# Patient Record
Sex: Female | Born: 2000 | Race: Black or African American | Hispanic: No | Marital: Single | Smoking: Never smoker
Health system: Southern US, Community
[De-identification: ages and names within clinical notes are randomized; demographics above are authoritative.]

## PROBLEM LIST (undated history)

## (undated) DIAGNOSIS — D649 Anemia, unspecified: Secondary | ICD-10-CM

## (undated) DIAGNOSIS — Z789 Other specified health status: Secondary | ICD-10-CM

## (undated) DIAGNOSIS — E875 Hyperkalemia: Secondary | ICD-10-CM

## (undated) HISTORY — PX: NO PAST SURGERIES: SHX2092

## (undated) HISTORY — PX: WISDOM TOOTH EXTRACTION: SHX21

---
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
## 2021-06-19 ENCOUNTER — Emergency Department (HOSPITAL_COMMUNITY)
Admission: EM | Admit: 2021-06-19 | Discharge: 2021-06-19 | Disposition: A | Discharge status: Home / Self Care | Payer: Medicaid Other | Attending: Emergency Medicine | Admitting: Emergency Medicine

## 2021-06-19 ENCOUNTER — Other Ambulatory Visit: Payer: Self-pay

## 2021-06-19 DIAGNOSIS — Z20822 Contact with and (suspected) exposure to covid-19: Secondary | ICD-10-CM | POA: Insufficient documentation

## 2021-06-19 DIAGNOSIS — J029 Acute pharyngitis, unspecified: Secondary | ICD-10-CM | POA: Diagnosis not present

## 2021-06-19 LAB — PREGNANCY, URINE: Preg Test, Ur: NEGATIVE

## 2021-06-19 LAB — RESP PANEL BY RT-PCR (FLU A&B, COVID) ARPGX2
Influenza A by PCR: NEGATIVE
Influenza B by PCR: NEGATIVE
SARS Coronavirus 2 by RT PCR: NEGATIVE

## 2021-06-19 LAB — GROUP A STREP BY PCR: Group A Strep by PCR: NOT DETECTED

## 2021-06-19 MED ORDER — LIDOCAINE VISCOUS HCL 2 % MT SOLN: 5.0000 mL | Freq: Three times a day (TID) | OROMUCOSAL | 0 refills | Status: DC | PRN

## 2021-06-19 NOTE — Discharge Instructions (Addendum)
Your COVID, flu, and strep test were all negative. Your symptoms are likely viral in nature.   Please take Ibuprofen and Tylenol as needed for pain. Pick up magic mouthwash and use as needed.   Follow up with your PCP for further eval  Return to the ED for any new/worsening symptoms

## 2021-06-19 NOTE — ED Triage Notes (Signed)
Pt reports sore throat since Friday. Pt reports taking dayquil and mucinex at home. Pt also reports nasal congestion

## 2021-06-19 NOTE — ED Provider Notes (Signed)
[facility] DEPT Provider Note   CSN: [id number] Arrival date & time: 06/19/21  [id number]     History  No chief complaint on file.   Sarah Hopkins is a 21 y.o. female who presents to the ED today with complaint of gradual onset, constant, achy, sore throat for the past 3 days.  She also complains of new onset dry cough and nasal congestion today.  She denies any fevers.  She has been taking over-the-counter DayQuil and NyQuil without much relief.  She is still able to swallow however states that it is painful.  Denies any voice change.  Denies any recent sick contacts.  Is vaccinated for COVID and flu.  Reports history of strep in the past however is unsure if this feels similar.   The history is provided by the patient and medical records.      Home Medications Prior to Admission medications   Medication Sig Start Date End Date Taking? Authorizing Provider  magic mouthwash (lidocaine, diphenhydrAMINE, alum & mag hydroxide) suspension Swish and spit 5 mLs 3 (three) times daily as needed for mouth pain. 06/19/21  Yes Derrell Milanes, PA-C  bacitracin 500 UNIT/GM ointment Apply 1 application topically 2 (two) times daily. Patient not taking: No sig reported 12/06/13   Presson, Audelia Hives, PA  doxycycline (VIBRAMYCIN) 100 MG capsule Take 1 capsule (100 mg total) by mouth 2 (two) times daily. 10/04/20   Caccavale, Sophia, PA-C  ibuprofen (ADVIL,MOTRIN) 400 MG tablet Take 1 tablet (400 mg total) by mouth every 6 (six) hours as needed. Patient not taking: No sig reported 11/21/17   Griffin Basil, NP  ondansetron (ZOFRAN ODT) 4 MG disintegrating tablet Take 1 tablet (4 mg total) by mouth every 8 (eight) hours as needed. Patient not taking: No sig reported 05/05/18   Charmayne Sheer, NP  promethazine-dextromethorphan (PROMETHAZINE-DM) 6.25-15 MG/5ML syrup Take 5 mLs by mouth 4 (four) times daily as needed for cough. Patient not taking: No sig reported 06/07/16    Etta Quill, NP      Allergies    Prednisone    Review of Systems   Review of Systems  Constitutional:  Negative for chills and fever.  HENT:  Positive for congestion and sore throat. Negative for trouble swallowing and voice change.   Respiratory:  Positive for cough.   Musculoskeletal:  Negative for myalgias.  All other systems reviewed and are negative.  Physical Exam Updated Vital Signs BP (!) 142/82    Pulse 99    Temp 99.4 F (37.4 C) (Oral)    Resp 18    SpO2 100%  Physical Exam Vitals and nursing note reviewed.  Constitutional:      Appearance: She is not ill-appearing.  HENT:     Head: Normocephalic and atraumatic.     Mouth/Throat:     Mouth: Mucous membranes are moist.     Pharynx: Oropharyngeal exudate and posterior oropharyngeal erythema present.     Comments: Bilateral tonsillar hypertrophy with exudate on right tonsil. Uvula midline. Tolerating secretions without difficulty and phonating normally.  Eyes:     Conjunctiva/sclera: Conjunctivae normal.  Cardiovascular:     Rate and Rhythm: Normal rate and regular rhythm.     Pulses: Normal pulses.  Pulmonary:     Effort: Pulmonary effort is normal.     Breath sounds: Normal breath sounds. No wheezing, rhonchi or rales.  Lymphadenopathy:     Cervical: No cervical adenopathy.  Skin:    General: Skin is  warm and dry.     Coloration: Skin is not jaundiced.  Neurological:     Mental Status: She is alert.    ED Results / Procedures / Treatments   Labs (all labs ordered are listed, but only abnormal results are displayed) Labs Reviewed  GROUP A STREP BY PCR  RESP PANEL BY RT-PCR (FLU A&B, COVID) [id number]  PREGNANCY, URINE    EKG None  Radiology No results found.  Procedures Procedures    Medications Ordered in ED Medications - No data to display  ED Course/ Medical Decision Making/ A&P                           Medical Decision Making 21 year old female who presents to the ED today with  complaint of sore throat x3 days, nasal congestion and cough x1 day.  On arrival to the ED today patient temperature is 99.4.  Remainder vitals are unremarkable.  She appears to be no acute distress.  Phonating normally and tolerating own secretions without difficulty.  She is noted to have bilateral tonsillar hypertrophy with exudate on right tonsil on exam.  Lungs clear to auscultation bilaterally.  Plan to swab for COVID, flu, strep at this time.  We will plan for urine pregnancy test in case patient requires antibiotics.  If negative suspect viral pharyngitis.  Problems Addressed: Viral pharyngitis: acute illness or injury  Amount and/or Complexity of Data Reviewed Labs: ordered.    Details: UPT negative COVID and flu negative STrep test negative          Final Clinical Impression(s) / ED Diagnoses Final diagnoses:  Viral pharyngitis    Rx / DC Orders ED Discharge Orders          Ordered    magic mouthwash (lidocaine, diphenhydrAMINE, alum & mag hydroxide) suspension  3 times daily PRN        06/19/21 0938             Discharge Instructions      Your COVID, flu, and strep test were all negative. Your symptoms are likely viral in nature.   Please take Ibuprofen and Tylenol as needed for pain. Pick up magic mouthwash and use as needed.   Follow up with your PCP for further eval  Return to the ED for any new/worsening symptoms       Eustaquio Maize, PA-C 06/19/21 [id number]    Isla Pence, MD 06/19/21 1544

## 2021-08-22 ENCOUNTER — Emergency Department (HOSPITAL_COMMUNITY)
Admission: EM | Admit: 2021-08-22 | Discharge: 2021-08-23 | Discharge status: Against Medical Advice | Payer: Medicaid Other | Attending: Emergency Medicine | Admitting: Emergency Medicine

## 2021-08-22 ENCOUNTER — Other Ambulatory Visit: Payer: Self-pay

## 2021-08-22 ENCOUNTER — Encounter (HOSPITAL_COMMUNITY): Payer: Self-pay

## 2021-08-22 DIAGNOSIS — R1030 Lower abdominal pain, unspecified: Secondary | ICD-10-CM | POA: Insufficient documentation

## 2021-08-22 DIAGNOSIS — Z5321 Procedure and treatment not carried out due to patient leaving prior to being seen by health care provider: Secondary | ICD-10-CM | POA: Diagnosis not present

## 2021-08-22 NOTE — ED Provider Triage Note (Signed)
Emergency Medicine Provider Triage Evaluation Note ? ?Sarah Hopkins , a 21 y.o. female  was evaluated in triage.  Pt complains of left lower pelvic/suprapubic pain that started about 5 days ago and has been constant since then.  She describes the pain as a measure between a menstrual cramp and a "charley horse".  She has taken over-the-counter medications without improvement.  She denies nausea, vomiting, diarrhea, vaginal discharge, vaginal pain, urinary symptoms. ? ?Review of Systems  ?Positive:  ?Negative:  ? ?Physical Exam  ?BP (!) 124/91 (BP Location: Left Arm)   Pulse 76   Temp 98.1 ?F (36.7 ?C) (Oral)   Resp 18   Ht 5\' 1"  (1.549 m)   Wt 95.3 kg   SpO2 100%   BMI 39.68 kg/m?  ?Gen:   Awake, no distress, nontoxic-appearing, able to speak in complete sentences ?Resp:  Normal effort  ?MSK:   Moves extremities without difficulty  ?Other:  Mild to moderate tenderness palpation of the left pelvic region.  Abdomen is otherwise soft, nondistended. ? ?Medical Decision Making  ?Medically screening exam initiated at 11:53 PM.  Appropriate orders placed.  Lindamarie BAILI STANG was informed that the remainder of the evaluation will be completed by another provider, this initial triage assessment does not replace that evaluation, and the importance of remaining in the ED until their evaluation is complete. ? ? ?  ?Jannetta Quint, Janell Quiet ?08/22/21 2355 ? ?

## 2021-08-22 NOTE — ED Triage Notes (Signed)
Patient has had lower abdominal pain below her belly button for 2 days. She said it feels like a charley horse with a mixture of period cramps. No vomiting or diarrhea.  ?

## 2021-08-23 ENCOUNTER — Other Ambulatory Visit: Payer: Self-pay

## 2021-08-23 ENCOUNTER — Encounter (HOSPITAL_COMMUNITY): Payer: Self-pay

## 2021-08-23 ENCOUNTER — Emergency Department (HOSPITAL_COMMUNITY)
Admission: EM | Admit: 2021-08-23 | Discharge: 2021-08-23 | Disposition: A | Discharge status: Home / Self Care | Payer: Medicaid Other | Attending: Medical | Admitting: Medical

## 2021-08-23 DIAGNOSIS — Z5321 Procedure and treatment not carried out due to patient leaving prior to being seen by health care provider: Secondary | ICD-10-CM | POA: Diagnosis not present

## 2021-08-23 DIAGNOSIS — R110 Nausea: Secondary | ICD-10-CM | POA: Diagnosis not present

## 2021-08-23 DIAGNOSIS — R1032 Left lower quadrant pain: Secondary | ICD-10-CM | POA: Diagnosis present

## 2021-08-23 HISTORY — DX: Hyperkalemia: E87.5

## 2021-08-23 LAB — URINALYSIS, ROUTINE W REFLEX MICROSCOPIC
Bacteria, UA: NONE SEEN
Bilirubin Urine: NEGATIVE
Glucose, UA: NEGATIVE mg/dL
Hgb urine dipstick: NEGATIVE
Ketones, ur: 5 mg/dL — AB
Nitrite: NEGATIVE
Protein, ur: NEGATIVE mg/dL
Specific Gravity, Urine: 1.021 (ref 1.005–1.030)
pH: 6 (ref 5.0–8.0)

## 2021-08-23 NOTE — ED Triage Notes (Signed)
Patient c/o LLQ abdominal pain and nausea x 7 days. Patient denies any V/D. ?

## 2021-08-23 NOTE — ED Notes (Signed)
Called pt. To triage x 2, no answer. ?

## 2021-08-23 NOTE — ED Notes (Signed)
I called patient for lab draw in the lobby and outside and no one responded. ?

## 2021-08-23 NOTE — ED Provider Triage Note (Signed)
Emergency Medicine Provider Triage Evaluation Note ? ?Sarah Hopkins , a 21 y.o. female  was evaluated in triage.  Pt complains of vaginal onset, constant, achy, suprapubic/left lower quadrant abdominal pain that began 1 week ago.  Patient also complains of nausea.  She was here in the ED yesterday however left prior to being seen.  She denies any recent sick contacts.  No vomiting, diarrhea.  Last normal bowel movement earlier today.  She denies any vaginal discharge.  Last normal menstrual cycle on 04/01. ? ?Review of Systems  ?Positive: + abd pain, nausea ?Negative: - fevers, chills, urinary symptoms, vaginal discharge, vomiting, diarrhea, constipation ? ?Physical Exam  ?BP 130/87 (BP Location: Left Arm)   Pulse 92   Temp 98.2 ?F (36.8 ?C) (Oral)   Resp 16   Ht 5\' 1"  (1.549 m)   Wt 95.3 kg   LMP 08/06/2021 (Approximate)   SpO2 99%   BMI 39.68 kg/m?  ?Gen:   Awake, no distress   ?Resp:  Normal effort  ?MSK:   Moves extremities without difficulty  ?Other:  + suprapubic and LLQ TTP with voluntary guarding ? ?Medical Decision Making  ?Medically screening exam initiated at 12:45 PM.  Appropriate orders placed.  Sarah Hopkins was informed that the remainder of the evaluation will be completed by another provider, this initial triage assessment does not replace that evaluation, and the importance of remaining in the ED until their evaluation is complete. ? ? ?  ?Jannetta Quint, PA-C ?08/23/21 1247 ? ?

## 2021-08-23 NOTE — ED Notes (Signed)
I called patient to collect labs and no one responded 

## 2022-04-19 ENCOUNTER — Encounter: Payer: Self-pay | Admitting: Obstetrics

## 2022-04-19 ENCOUNTER — Ambulatory Visit (INDEPENDENT_AMBULATORY_CARE_PROVIDER_SITE_OTHER): Payer: Medicaid Other

## 2022-04-19 ENCOUNTER — Ambulatory Visit: Payer: Medicaid Other | Admitting: *Deleted

## 2022-04-19 VITALS — BP 126/84 | HR 86 | Ht 61.0 in | Wt 196.9 lb

## 2022-04-19 DIAGNOSIS — O3680X0 Pregnancy with inconclusive fetal viability, not applicable or unspecified: Secondary | ICD-10-CM

## 2022-04-19 DIAGNOSIS — Z3480 Encounter for supervision of other normal pregnancy, unspecified trimester: Secondary | ICD-10-CM

## 2022-04-19 DIAGNOSIS — Z3A08 8 weeks gestation of pregnancy: Secondary | ICD-10-CM | POA: Diagnosis not present

## 2022-04-19 DIAGNOSIS — O219 Vomiting of pregnancy, unspecified: Secondary | ICD-10-CM

## 2022-04-19 MED ORDER — BLOOD PRESSURE KIT DEVI: 1.0000 | 0 refills | Status: AC

## 2022-04-19 MED ORDER — PROMETHAZINE HCL 25 MG PO TABS: 25.0000 mg | ORAL_TABLET | Freq: Four times a day (QID) | ORAL | 1 refills | Status: DC | PRN

## 2022-04-19 NOTE — Progress Notes (Signed)
New OB Intake  I connected withNAME@ on 04/19/22 at 10:15 AM EST by In Person Visit and verified that I am speaking with the correct person using two identifiers. Nurse is located at [facility] and pt is located at [place].  I discussed the limitations, risks, security and privacy concerns of performing an evaluation and management service by telephone and the availability of in person appointments. I also discussed with the patient that there may be a patient responsible charge related to this service. The patient expressed understanding and agreed to proceed.  I explained I am completing New OB Intake today. We discussed EDD of 11/21/22 that is based on LMP of 02/14/22. Pt is G1/P0. I reviewed her allergies, medications, Medical/Surgical/OB history, and appropriate screenings. I informed her of [facility] services. [facility] information placed in AVS. Based on history, this is a low risk pregnancy.  There are no problems to display for this patient.   Concerns addressed today  Delivery Plans Plans to deliver at [facility]. Patient given information for [facility] website for more information about [facility]. Patient is not interested in water birth. Offered upcoming OB visit with CNM to discuss further.  MyChart/Babyscripts MyChart access verified. I explained pt will have some visits in office and some virtually. Babyscripts instructions given and order placed. Patient verifies receipt of registration text/e-mail. Account successfully created and app downloaded.  Blood Pressure Cuff/Weight Scale Blood pressure cuff ordered for patient to pick-up from [employer]. Explained after first prenatal appt pt will check weekly and document in Babyscripts. Patient does not have weight scale; patient may purchase if they desire to track weight weekly in Babyscripts.  Anatomy US Explained first scheduled [place] will be around 19 weeks. Anatomy US scheduled for 19 wks at [facility]. Pt notified to  arrive at TBD.  Labs Discussed Avelina Laine genetic screening with patient. Would like both Panorama and Horizon drawn at new OB visit. Routine prenatal labs needed.  COVID Vaccine Patient has had COVID vaccine.   Social Determinants of Health Food Insecurity: Patient denies food insecurity. WIC Referral: Patient is interested in referral to [facility].  Transportation: Patient denies transportation needs. Childcare: Discussed no children allowed at ultrasound appointments. Offered childcare services; patient declines childcare services at this time.  First visit review I reviewed new OB appt with patient. I explained they will have a provider visit that includes labs. Explained pt will be seen by Dr. Clearance Coots at first visit; encounter routed to appropriate provider. Explained that patient will be seen by pregnancy navigator following visit with provider.   Harrel Lemon, RN 04/19/2022  10:32 AM

## 2022-04-24 ENCOUNTER — Other Ambulatory Visit: Payer: Self-pay

## 2022-04-24 ENCOUNTER — Encounter (HOSPITAL_COMMUNITY): Payer: Self-pay | Admitting: Family Medicine

## 2022-04-24 ENCOUNTER — Inpatient Hospital Stay (HOSPITAL_COMMUNITY): Payer: Medicaid Other

## 2022-04-24 ENCOUNTER — Inpatient Hospital Stay (HOSPITAL_COMMUNITY)
Admission: AD | Admit: 2022-04-24 | Discharge: 2022-04-24 | Disposition: A | Discharge status: Home / Self Care | Payer: Medicaid Other | Attending: Family Medicine | Admitting: Family Medicine

## 2022-04-24 DIAGNOSIS — B338 Other specified viral diseases: Secondary | ICD-10-CM

## 2022-04-24 DIAGNOSIS — Z3A09 9 weeks gestation of pregnancy: Secondary | ICD-10-CM | POA: Diagnosis not present

## 2022-04-24 DIAGNOSIS — B974 Respiratory syncytial virus as the cause of diseases classified elsewhere: Secondary | ICD-10-CM | POA: Diagnosis not present

## 2022-04-24 DIAGNOSIS — O26891 Other specified pregnancy related conditions, first trimester: Secondary | ICD-10-CM | POA: Diagnosis present

## 2022-04-24 DIAGNOSIS — O99511 Diseases of the respiratory system complicating pregnancy, first trimester: Secondary | ICD-10-CM | POA: Insufficient documentation

## 2022-04-24 HISTORY — DX: Other specified health status: Z78.9

## 2022-04-24 LAB — CBC
HCT: 36.4 % (ref 36.0–46.0)
Hemoglobin: 12.6 g/dL (ref 12.0–15.0)
MCH: 30.1 pg (ref 26.0–34.0)
MCHC: 34.6 g/dL (ref 30.0–36.0)
MCV: 86.9 fL (ref 80.0–100.0)
Platelets: 309 10*3/uL (ref 150–400)
RBC: 4.19 MIL/uL (ref 3.87–5.11)
RDW: 12.2 % (ref 11.5–15.5)
WBC: 8.2 10*3/uL (ref 4.0–10.5)
nRBC: 0 % (ref 0.0–0.2)

## 2022-04-24 LAB — URINALYSIS, ROUTINE W REFLEX MICROSCOPIC
Bilirubin Urine: NEGATIVE
Glucose, UA: NEGATIVE mg/dL
Hgb urine dipstick: NEGATIVE
Ketones, ur: NEGATIVE mg/dL
Leukocytes,Ua: NEGATIVE
Nitrite: NEGATIVE
Protein, ur: NEGATIVE mg/dL
Specific Gravity, Urine: 1.008 (ref 1.005–1.030)
pH: 6 (ref 5.0–8.0)

## 2022-04-24 NOTE — MAU Note (Addendum)
.  Sarah Hopkins is a 21 y.o. at [redacted]w[redacted]d here in MAU reporting: she has been having runny nose, sneezing, productive cough (snot-like sputum), sharp, right-sided chest pain when she coughs (7/10), SOB with exertion or when laying down, generalized body aches (9/10), loss of appetite, N/V (1 episode of vomiting today) since 04/15/2022. She has taken Promethazine and no longer feels nauseous. Dx with RSV on 04/22/22 by primary care. She has been taking Mucinex, Robitussin, and Tylenol Cold & Flu since being sick, has not taken these in the past two days and did not get any relief from her symptoms. Denies VB.   LMP: 02/14/2022 Onset of complaint: 04/15/22 Pain score: 7/10 chest pain, 9/10 generalized body aches Vitals:   04/24/22 0959  BP: 127/74  Pulse: 86  Resp: 18  Temp: 98.3 F (36.8 C)  SpO2: 100%     FHT:N/A Lab orders placed from triage:  UA

## 2022-04-24 NOTE — Discharge Instructions (Signed)

## 2022-04-24 NOTE — MAU Provider Note (Signed)
History     CSN: [id number]  Arrival date and time: 04/24/22 2355   Event Date/Time   First Provider Initiated Contact with Patient 04/24/22 1013      Chief Complaint  Patient presents with   Chest Pain   Cough   Generalized Body Aches   HPI  Sarah Hopkins is a 21 y.o. G1P0000 at [redacted]w[redacted]d who presents for evaluation of cough and shortness of breath. Patient reports she was diagnosed with RSV on 12/16. She is taking OTC symptom relief with minimal relief. She reports she feels short of breath when she lays down and it hurts to cough. She states her mom wanted her to come in and make sure she does not have pneumonia.  She denies any pain. She denies any vaginal bleeding, discharge, and leaking of fluid. Denies any constipation, diarrhea or any urinary complaints.    OB History     Gravida  1   Para  0   Term  0   Preterm  0   AB  0   Living  0      SAB  0   IAB  0   Ectopic  0   Multiple  0   Live Births  0           Past Medical History:  Diagnosis Date   Hyperkalemia    Medical history non-contributory     Past Surgical History:  Procedure Laterality Date   NO PAST SURGERIES      Family History  Problem Relation Age of Onset   Hypertension Paternal Grandfather    Kidney failure Paternal Grandfather    Heart Problems Maternal Grandmother    Diabetes Maternal Grandfather     Social History   Tobacco Use   Smoking status: Never   Smokeless tobacco: Never  Vaping Use   Vaping Use: Never used  Substance Use Topics   Alcohol use: No    Comment: social   Drug use: Not Currently    Types: Marijuana    Allergies:  Allergies  Allergen Reactions   Prednisone Anaphylaxis    Other reaction(s): Cramps (ALLERGY/intolerance) Insomnia, severe Depression/moody    No medications prior to admission.    Review of Systems  Constitutional:  Positive for fatigue. Negative for fever.  HENT:  Positive for congestion, sinus pressure and  sneezing.   Respiratory:  Positive for cough and shortness of breath.   Cardiovascular: Negative.  Negative for chest pain.  Gastrointestinal: Negative.  Negative for abdominal pain, constipation, diarrhea, nausea and vomiting.  Genitourinary: Negative.  Negative for dysuria, vaginal bleeding and vaginal discharge.  Musculoskeletal:  Positive for myalgias.  Neurological: Negative.  Negative for dizziness and headaches.   Physical Exam   Blood pressure 117/79, pulse 78, temperature 98.3 F (36.8 C), temperature source Oral, resp. rate 18, height 5\' 1"  (1.549 m), weight 92.4 kg, last menstrual period 02/14/2022, SpO2 100 %.  Patient Vitals for the past 24 hrs:  BP Temp Temp src Pulse Resp SpO2 Height Weight  04/24/22 1057 117/79 -- -- 78 -- -- -- --  04/24/22 1006 119/76 -- -- 95 -- 100 % -- --  04/24/22 0959 127/74 98.3 F (36.8 C) Oral 86 18 100 % 5\' 1"  (1.549 m) 92.4 kg    Physical Exam Vitals and nursing note reviewed.  Constitutional:      General: She is not in acute distress.    Appearance: She is well-developed.  HENT:     Head:  Normocephalic.  Eyes:     Pupils: Pupils are equal, round, and reactive to light.  Cardiovascular:     Rate and Rhythm: Normal rate and regular rhythm.     Heart sounds: Normal heart sounds.  Pulmonary:     Effort: Pulmonary effort is normal. No respiratory distress.     Breath sounds: Normal breath sounds.  Abdominal:     General: Bowel sounds are normal. There is no distension.     Palpations: Abdomen is soft.     Tenderness: There is no abdominal tenderness.  Skin:    General: Skin is warm and dry.  Neurological:     Mental Status: She is alert and oriented to person, place, and time.  Psychiatric:        Mood and Affect: Mood normal.        Behavior: Behavior normal.        Thought Content: Thought content normal.        Judgment: Judgment normal.     MAU Course  Procedures  Results for orders placed or performed during the  hospital encounter of 04/24/22 (from the past 24 hour(s))  Urinalysis, Routine w reflex microscopic     Status: None   Collection Time: 04/24/22 10:12 AM  Result Value Ref Range   Color, Urine YELLOW YELLOW   APPearance CLEAR CLEAR   Specific Gravity, Urine 1.008 1.005 - 1.030   pH 6.0 5.0 - 8.0   Glucose, UA NEGATIVE NEGATIVE mg/dL   Hgb urine dipstick NEGATIVE NEGATIVE   Bilirubin Urine NEGATIVE NEGATIVE   Ketones, ur NEGATIVE NEGATIVE mg/dL   Protein, ur NEGATIVE NEGATIVE mg/dL   Nitrite NEGATIVE NEGATIVE   Leukocytes,Ua NEGATIVE NEGATIVE  CBC     Status: None   Collection Time: 04/24/22 10:29 AM  Result Value Ref Range   WBC 8.2 4.0 - 10.5 K/uL   RBC 4.19 3.87 - 5.11 MIL/uL   Hemoglobin 12.6 12.0 - 15.0 g/dL   HCT 04.5 99.7 - 74.1 %   MCV 86.9 80.0 - 100.0 fL   MCH 30.1 26.0 - 34.0 pg   MCHC 34.6 30.0 - 36.0 g/dL   RDW 42.3 95.3 - 20.2 %   Platelets 309 150 - 400 K/uL   nRBC 0.0 0.0 - 0.2 %     DG Chest 2 View  Result Date: 04/24/2022 CLINICAL DATA:  Shortness of breath. EXAM: CHEST - 2 VIEW COMPARISON:  June 05, 2010. FINDINGS: The heart size and mediastinal contours are within normal limits. Both lungs are clear. The visualized skeletal structures are unremarkable. IMPRESSION: No active cardiopulmonary disease. Electronically Signed   By: Lupita Raider M.D.   On: 04/24/2022 10:47     MDM Labs ordered and reviewed.   UA CBC Chest Xray  CNM provided reassurance of normalcy of exam and vital signs within normal limits. Reviewed typical course of RSV and options for symptom treatment reviewed again.   Assessment and Plan   1. RSV (respiratory syncytial virus infection)   2. [redacted] weeks gestation of pregnancy     -Discharge home in stable condition -URI precautions discussed -Patient advised to follow-up with OB as scheduled for prenatal care -Patient may return to MAU as needed or if her condition were to change or worsen  Rolm Bookbinder, CNM 04/24/2022,  10:14 AM

## 2022-05-03 ENCOUNTER — Encounter: Payer: Self-pay | Admitting: Obstetrics & Gynecology

## 2022-05-05 ENCOUNTER — Telehealth: Payer: Self-pay | Admitting: Emergency Medicine

## 2022-05-05 NOTE — Telephone Encounter (Signed)
TC to patient to discuss blood pressure readings, discussed Dr. Roberts Gaudy instructions from 12/27 note.  Hello,    We were notified of your one elevated BP reading.  Once you get any elevated reading, please retake it in about 15 minutes. Please make sure you have been sitting and resting for at least 5 minutes before checking your blood pressure and that you are not talking to anyone during the reading.  If the top number is above 140 or bottom number is above 90, we will be notified and we will contact you with further recommendations. Given that you have no history of blood pressure issues, no intervention is needed for now. If your BP continues to be elevated, we may have to start you on some medications to help with this.   Please continue to check your BP as instructed and let us know of any concerns.     Jaynie Collins, MD

## 2022-05-11 ENCOUNTER — Encounter: Payer: Self-pay | Admitting: Obstetrics

## 2022-05-11 ENCOUNTER — Ambulatory Visit (INDEPENDENT_AMBULATORY_CARE_PROVIDER_SITE_OTHER): Payer: Medicaid Other | Admitting: Licensed Clinical Social Worker

## 2022-05-11 ENCOUNTER — Other Ambulatory Visit (HOSPITAL_COMMUNITY)
Admission: RE | Admit: 2022-05-11 | Discharge: 2022-05-11 | Disposition: A | Discharge status: Home / Self Care | Payer: Medicaid Other | Source: Ambulatory Visit | Attending: Obstetrics | Admitting: Obstetrics

## 2022-05-11 ENCOUNTER — Ambulatory Visit (INDEPENDENT_AMBULATORY_CARE_PROVIDER_SITE_OTHER): Payer: Medicaid Other | Admitting: Obstetrics

## 2022-05-11 VITALS — BP 119/80 | HR 83 | Wt 205.7 lb

## 2022-05-11 DIAGNOSIS — Z3A12 12 weeks gestation of pregnancy: Secondary | ICD-10-CM

## 2022-05-11 DIAGNOSIS — O219 Vomiting of pregnancy, unspecified: Secondary | ICD-10-CM

## 2022-05-11 DIAGNOSIS — Z3480 Encounter for supervision of other normal pregnancy, unspecified trimester: Secondary | ICD-10-CM

## 2022-05-11 DIAGNOSIS — Z3481 Encounter for supervision of other normal pregnancy, first trimester: Secondary | ICD-10-CM

## 2022-05-11 MED ORDER — PROMETHAZINE HCL 25 MG PO TABS: 25.0000 mg | ORAL_TABLET | Freq: Four times a day (QID) | ORAL | 1 refills | Status: DC | PRN

## 2022-05-11 NOTE — Progress Notes (Signed)
Patient presents for New OB. Patient has concerns about her blood pressure. No other concerns today.

## 2022-05-11 NOTE — Progress Notes (Signed)
Subjective:    Sarah Hopkins is being seen today for her first obstetrical visit.  This is not a planned pregnancy. She is at [redacted]w[redacted]d gestation. Her obstetrical history is significant for  none . Relationship with FOB: significant other, not living together. Patient does intend to breast feed. Pregnancy history fully reviewed.  The information documented in the HPI was reviewed and verified.  Menstrual History: OB History     Gravida  1   Para  0   Term  0   Preterm  0   AB  0   Living  0      SAB  0   IAB  0   Ectopic  0   Multiple  0   Live Births  0           Patient's last menstrual period was 02/14/2022 (exact date).    Past Medical History:  Diagnosis Date   Hyperkalemia     Past Surgical History:  Procedure Laterality Date   NO PAST SURGERIES      (Not in a hospital admission)  Allergies  Allergen Reactions   Prednisone Anaphylaxis    Other reaction(s): Cramps (ALLERGY/intolerance) Insomnia, severe Depression/moody    Social History   Tobacco Use   Smoking status: Never   Smokeless tobacco: Never  Substance Use Topics   Alcohol use: No    Comment: social    Family History  Problem Relation Age of Onset   Hypertension Paternal Grandfather    Kidney failure Paternal Grandfather    Heart Problems Maternal Grandmother    Diabetes Maternal Grandfather      Review of Systems Constitutional: negative for weight loss Gastrointestinal: negative for vomiting Genitourinary:negative for genital lesions and vaginal discharge and dysuria Musculoskeletal:negative for back pain Behavioral/Psych: negative for abusive relationship, depression, illegal drug usage and tobacco use    Objective:    BP 119/80   Pulse 83   Wt 205 lb 11.2 oz (93.3 kg)   LMP 02/14/2022 (Exact Date)   BMI 38.87 kg/m  General Appearance:    Alert, cooperative, no distress, appears stated age  Head:    Normocephalic, without obvious abnormality, atraumatic  Eyes:     PERRL, conjunctiva/corneas clear, EOM's intact, fundi    benign, both eyes  Ears:    Normal TM's and external ear canals, both ears  Nose:   Nares normal, septum midline, mucosa normal, no drainage    or sinus tenderness  Throat:   Lips, mucosa, and tongue normal; teeth and gums normal  Neck:   Supple, symmetrical, trachea midline, no adenopathy;    thyroid:  no enlargement/tenderness/nodules; no carotid   bruit or JVD  Back:     Symmetric, no curvature, ROM normal, no CVA tenderness  Lungs:     Clear to auscultation bilaterally, respirations unlabored  Chest Wall:    No tenderness or deformity   Heart:    Regular rate and rhythm, S1 and S2 normal, no murmur, rub   or gallop  Breast Exam:    No tenderness, masses, or nipple abnormality  Abdomen:     Soft, non-tender, bowel sounds active all four quadrants,    no masses, no organomegaly  Genitalia:    Normal female without lesion, discharge or tenderness  Extremities:   Extremities normal, atraumatic, no cyanosis or edema  Pulses:   2+ and symmetric all extremities  Skin:   Skin color, texture, turgor normal, no rashes or lesions  Lymph nodes:  Cervical, supraclavicular, and axillary nodes normal  Neurologic:   CNII-XII intact, normal strength, sensation and reflexes    throughout      Lab Review Urine pregnancy test Labs reviewed yes Radiologic studies reviewed no  Assessment:    Pregnancy at [redacted]w[redacted]d weeks    Plan:      Prenatal vitamins.  Counseling provided regarding continued use of seat belts, cessation of alcohol consumption, smoking or use of illicit drugs; infection precautions i.e., influenza/TDAP immunizations, toxoplasmosis,CMV, parvovirus, listeria and varicella; workplace safety, exercise during pregnancy; routine dental care, safe medications, sexual activity, hot tubs, saunas, pools, travel, caffeine use, fish and methlymercury, potential toxins, hair treatments, varicose veins Weight gain recommendations per IOM  guidelines reviewed: underweight/BMI< 18.5--> gain 28 - 40 lbs; normal weight/BMI 18.5 - 24.9--> gain 25 - 35 lbs; overweight/BMI 25 - 29.9--> gain 15 - 25 lbs; obese/BMI >30->gain  11 - 20 lbs Problem list reviewed and updated. FIRST/CF mutation testing/NIPT/QUAD SCREEN/fragile X/Ashkenazi Jewish population testing/Spinal muscular atrophy discussed: requested. Role of ultrasound in pregnancy discussed; fetal survey: requested. Amniocentesis discussed: not indicated.  Meds ordered this encounter  Medications   promethazine (PHENERGAN) 25 MG tablet    Sig: Take 1 tablet (25 mg total) by mouth every 6 (six) hours as needed for nausea or vomiting.    Dispense:  30 tablet    Refill:  1   Orders Placed This Encounter  Procedures   Culture, OB Urine   [place] MFM OB COMP + 14 WK    Standing Status:   Future    Standing Expiration Date:   05/12/2023    Order Specific Question:   Reason for Exam (SYMPTOM  OR DIAGNOSIS REQUIRED)    Answer:   Anatomy    Order Specific Question:   Preferred Location    Answer:   [facility] Ultrasound   CBC/D/Plt+RPR+Rh+ABO+RubIgG...   Panorama Prenatal Test Full Panel    ==========Department Information========== ID: [id number] Department:[facility] [place], [place] [address] Dept: [phone] Dept Fax: [phone]     Order Specific Question:   Expected due date (MM/DD/YYYY):    Answer:   11/21/2022    Order Specific Question:   Is this a twin pregnancy? (viable, no vanished twin)    Answer:   No    Order Specific Question:   Is this a surrogate or egg donor pregnancy?    Answer:   No    Order Specific Question:   I want fetal sex included in the report:    Answer:   Yes    Order Specific Question:   Maternal Weight (lbs):    Answer:   31    Order Specific Question:   Which Microdeletion Panel should be ordered?    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    Answer:   Programme researcher, broadcasting/film/video Question:   By placing this electronic order I confirm the testing ordered herein is medically necessary and this patient has been informed of the details of the genetic test(s) ordered, including the risks, benefits, and alternatives, and has consented to testing.    Answer:   Yes    Order Specific Question:   Select an order diagnosis: For additional options refer to [email or web address]    Answer:   Encounter for  supervision of normal first pregnancy in first trimester [669301]   HORIZON Custom    ==========Department Information========== ID: [id number] Department:[facility] AT [facility] [address] [place] [address] Dept: [phone] Dept Fax: [phone]     Order Specific Question:   Specify the name or ID of a valid Horizon Custom Panel:    Answer:   H-Basic    Order Specific Question:   Is patient pregnant?    Answer:   Yes    Order Specific Question:   Ethnicity of patient:    Answer:   African American    Order Specific Question:   Practice ensures that HIPAA consent is obtained and will make available to [facility] upon request?    Answer:   Yes    Order Specific Question:   By placing this electronic order I confirm the testing ordered herein is medically necessary and this patient has been informed of the details of the genetic test(s) ordered, including the risks, benefits, and alternatives, and has consented to testing.    Answer:   Yes    Order Specific Question:   What type of billing?    Answer:   [employer] Specific Question:   Select an order diagnosis: For additional options refer to [email or web address]    Answer:   Encounter for supervision of other normal pregnancy, first trimester [4287681]    Order Specific Question:   Tay-Sachs add-on  test?    Answer:   No    Follow up in 4 weeks.  I have spent a total of 20 minutes of face-to-face time, excluding clinical staff time, reviewing notes and preparing to see patient, ordering tests and/or medications, and counseling the patient.   Marquavis Hannen A. Clearance Coots MD 05/11/2022

## 2022-05-12 ENCOUNTER — Other Ambulatory Visit: Payer: Self-pay | Admitting: Obstetrics

## 2022-05-12 DIAGNOSIS — N760 Acute vaginitis: Secondary | ICD-10-CM

## 2022-05-12 LAB — CBC/D/PLT+RPR+RH+ABO+RUBIGG...
Antibody Screen: NEGATIVE
Basophils Absolute: 0 10*3/uL (ref 0.0–0.2)
Basos: 0 %
EOS (ABSOLUTE): 0.1 10*3/uL (ref 0.0–0.4)
Eos: 1 %
HCV Ab: NONREACTIVE
HIV Screen 4th Generation wRfx: NONREACTIVE
Hematocrit: 35.7 % (ref 34.0–46.6)
Hemoglobin: 12.1 g/dL (ref 11.1–15.9)
Hepatitis B Surface Ag: NEGATIVE
Immature Grans (Abs): 0 10*3/uL (ref 0.0–0.1)
Immature Granulocytes: 0 %
Lymphocytes Absolute: 2.8 10*3/uL (ref 0.7–3.1)
Lymphs: 32 %
MCH: 29.6 pg (ref 26.6–33.0)
MCHC: 33.9 g/dL (ref 31.5–35.7)
MCV: 87 fL (ref 79–97)
Monocytes Absolute: 0.6 10*3/uL (ref 0.1–0.9)
Monocytes: 7 %
Neutrophils Absolute: 5.3 10*3/uL (ref 1.4–7.0)
Neutrophils: 60 %
Platelets: 324 10*3/uL (ref 150–450)
RBC: 4.09 x10E6/uL (ref 3.77–5.28)
RDW: 11.9 % (ref 11.7–15.4)
RPR Ser Ql: NONREACTIVE
Rh Factor: POSITIVE
Rubella Antibodies, IGG: 2.28 index (ref >0.99)
WBC: 8.8 10*3/uL (ref 3.4–10.8)

## 2022-05-12 LAB — CERVICOVAGINAL ANCILLARY ONLY
Bacterial Vaginitis (gardnerella): POSITIVE — AB
Candida Glabrata: NEGATIVE
Candida Vaginitis: NEGATIVE
Chlamydia: NEGATIVE
Comment: NEGATIVE
Comment: NEGATIVE
Comment: NEGATIVE
Comment: NEGATIVE
Comment: NEGATIVE
Comment: NORMAL
Neisseria Gonorrhea: NEGATIVE
Trichomonas: NEGATIVE

## 2022-05-12 LAB — HCV INTERPRETATION

## 2022-05-12 MED ORDER — METRONIDAZOLE 500 MG PO TABS: 500.0000 mg | ORAL_TABLET | Freq: Two times a day (BID) | ORAL | 2 refills | Status: DC

## 2022-05-13 LAB — URINE CULTURE, OB REFLEX

## 2022-05-13 LAB — CULTURE, OB URINE

## 2022-05-13 NOTE — BH Specialist Note (Signed)
Integrated Behavioral Health Initial In-Person Visit  MRN: [id number] Name: Sarah Hopkins  Number of [place] Clinician visits: 1 Session Start time:   1:45pm Session End time: 2:00pm Total time in minutes: 15 mins in person at [facility]  Types of Service: [place] (BHI)  Interpretor:No. Interpretor Name and Language: none   Warm Hand Off Completed.        Subjective: Sarah Hopkins is a 22 y.o. female accompanied by n/a Patient was referred by Dr Sarah Hopkins for new ob intro. Patient reports the following symptoms/concerns: no reported concerns  Duration of problem: n/a; Severity of problem: n/a  Objective: Mood: good  and Affect: Appropriate Risk of harm to self or others: No plan to harm self or others  Life Context: Family and Social: Lives in [place] School/Work: [employer]  Self-Care: n/a Life Changes: new pregnancy  Patient and/or Family's Strengths/Protective Factors: Concrete supports in place (healthy food, safe environments, etc.)  Goals Addressed: Patient will: Keep medical and prenatal appts  Take prenatal vitamins  Avoid Stress   Progress towards Goals: Ongoing  Interventions: Interventions utilized: Link to [employer]  Standardized Assessments completed: Not Needed  Patient and/or Family Response: Sarah Hopkins reports positive mood regarding pregnancy. Sarah Hopkins reports adequate support and safe living environment.     Sarah Ferrier, LCSW

## 2022-05-15 ENCOUNTER — Telehealth: Payer: Self-pay | Admitting: Emergency Medicine

## 2022-05-15 NOTE — Telephone Encounter (Signed)
-----   Message from Shelly Bombard, MD sent at 05/12/2022  5:19 PM EST ----- Flagyl Rx for BV

## 2022-05-15 NOTE — Telephone Encounter (Signed)
Pt informed of results, Rx.   

## 2022-05-16 ENCOUNTER — Encounter: Payer: Self-pay | Admitting: Obstetrics

## 2022-05-16 NOTE — Progress Notes (Signed)
[employer] breast pump form faxed.  

## 2022-05-17 LAB — PANORAMA PRENATAL TEST FULL PANEL:PANORAMA TEST PLUS 5 ADDITIONAL MICRODELETIONS: FETAL FRACTION: 7.8

## 2022-05-20 LAB — HORIZON CUSTOM: REPORT SUMMARY: NEGATIVE

## 2022-05-30 ENCOUNTER — Other Ambulatory Visit: Payer: Self-pay | Admitting: Emergency Medicine

## 2022-05-30 ENCOUNTER — Telehealth: Payer: Self-pay | Admitting: Emergency Medicine

## 2022-05-30 MED ORDER — ASPIRIN 81 MG PO TBEC: 81.0000 mg | DELAYED_RELEASE_TABLET | Freq: Every day | ORAL | 12 refills | Status: DC

## 2022-05-30 NOTE — Telephone Encounter (Signed)
TC to patient to discuss babyscripts BP reading of 161/99 on 1/21. Patient reports migraine headaches with elevated BP readings. Denies blurry vision but notes photophobia during migraine episodes.  Consult with Roselie Awkward, MD- Next apt is 2/1 with MD Elgie Congo. Patient does not need earlier apt, but should start on 81mg  ASA.   Rx sent to pharmacy. Pt informed of plan. No further questions at this time.

## 2022-05-30 NOTE — Progress Notes (Signed)
81mg  BASA per Roselie Awkward, MD

## 2022-06-08 ENCOUNTER — Other Ambulatory Visit (HOSPITAL_COMMUNITY)
Admission: RE | Admit: 2022-06-08 | Discharge: 2022-06-08 | Disposition: A | Discharge status: Home / Self Care | Payer: Medicaid Other | Source: Ambulatory Visit | Attending: Obstetrics and Gynecology | Admitting: Obstetrics and Gynecology

## 2022-06-08 ENCOUNTER — Ambulatory Visit (INDEPENDENT_AMBULATORY_CARE_PROVIDER_SITE_OTHER): Payer: Medicaid Other | Admitting: Obstetrics and Gynecology

## 2022-06-08 VITALS — BP 107/72 | HR 84 | Wt 208.0 lb

## 2022-06-08 DIAGNOSIS — N898 Other specified noninflammatory disorders of vagina: Secondary | ICD-10-CM | POA: Diagnosis present

## 2022-06-08 DIAGNOSIS — O26899 Other specified pregnancy related conditions, unspecified trimester: Secondary | ICD-10-CM | POA: Diagnosis present

## 2022-06-08 DIAGNOSIS — Z3480 Encounter for supervision of other normal pregnancy, unspecified trimester: Secondary | ICD-10-CM

## 2022-06-08 DIAGNOSIS — Z3402 Encounter for supervision of normal first pregnancy, second trimester: Secondary | ICD-10-CM

## 2022-06-08 DIAGNOSIS — Z3A16 16 weeks gestation of pregnancy: Secondary | ICD-10-CM

## 2022-06-08 NOTE — Progress Notes (Signed)
ROB, c/o vaginal discharge and odor.  Self swab done.

## 2022-06-08 NOTE — Progress Notes (Signed)
   PRENATAL VISIT NOTE  Subjective:  Sarah Hopkins is a 22 y.o. G1P0000 at [redacted]w[redacted]d being seen today for ongoing prenatal care.  She is currently monitored for the following issues for this low-risk pregnancy and has Supervision of other normal pregnancy, antepartum on their problem list.  Patient doing well with no acute concerns today. She reports no complaints.  Contractions: Not present. Vag. Bleeding: None.   . Denies leaking of fluid.   The following portions of the patient's history were reviewed and updated as appropriate: allergies, current medications, past family history, past medical history, past social history, past surgical history and problem list. Problem list updated.  Objective:   Vitals:   06/08/22 1334  BP: 107/72  Pulse: 84  Weight: 208 lb (94.3 kg)    Fetal Status: Fetal Heart Rate (bpm): 153         General:  Alert, oriented and cooperative. Patient is in no acute distress.  Skin: Skin is warm and dry. No rash noted.   Cardiovascular: Normal heart rate noted  Respiratory: Normal respiratory effort, no problems with respiration noted  Abdomen: Soft, gravid, appropriate for gestational age.  Pain/Pressure: Present     Pelvic: Cervical exam deferred        Extremities: Normal range of motion.  Edema: None  Mental Status:  Normal mood and affect. Normal behavior. Normal judgment and thought content.   Assessment and Plan:  Pregnancy: G1P0000 at [redacted]w[redacted]d  1. [redacted] weeks gestation of pregnancy   2. Supervision of other normal pregnancy, antepartum Continue routine prenatal care  - AFP, Serum, Open Spina Bifida - Ambulatory Referral to [facility]  3. Vaginal discharge during pregnancy, antepartum  - Cervicovaginal ancillary only( [place])  Preterm labor symptoms and general obstetric precautions including but not limited to vaginal bleeding, contractions, leaking of fluid and fetal movement were reviewed in detail with the patient.  Please refer to  After Visit Summary for other counseling recommendations.   Return in about 4 weeks (around 07/06/2022) for ROB, in person.   Lynnda Shields, MD Faculty Attending [facility]

## 2022-06-08 NOTE — Patient Instructions (Signed)
Considering Waterbirth? Guide for patients at [facility] ([facility]) Why consider waterbirth? Gentle birth for babies  Less pain medicine used in labor  May allow for passive descent/less pushing  May reduce perineal tears  More mobility and instinctive maternal position changes  Increased maternal relaxation   Is waterbirth safe? What are the risks of infection, drowning or other complications? Infection:  Very low risk (3.7 % for tub vs 4.8% for bed)  7 in 8000 waterbirths with documented infection  Poorly cleaned equipment most common cause  Slightly lower group B strep transmission rate  Drowning  Maternal:  Very low risk  Related to seizures or fainting  Newborn:  Very low risk. No evidence of increased risk of respiratory problems in multiple large studies  Physiological protection from breathing under water  Avoid underwater birth if there are any fetal complications  Once baby's head is out of the water, keep it out.  Birth complication  Some reports of cord trauma, but risk decreased by bringing baby to surface gradually  No evidence of increased risk of shoulder dystocia. Mothers can usually change positions faster in water than in a bed, possibly aiding the maneuvers to free the shoulder.   There are 2 things you MUST do to have a waterbirth with [facility]: Attend a waterbirth class at [facility] [place]   3rd Wednesday of every month from 7-9 pm (virtual during COVID) Free Register online at [email or web address] or [email or web address] or by calling [phone] Bring us the certificate from the class to your prenatal appointment or send via MyChart Meet with a midwife at 36 weeks* to see if you can still plan a waterbirth and to sign the consent.   *We also recommend that you schedule as many of your prenatal visits with a midwife as possible.    Helpful information: You may want to bring a bathing suit top to the hospital  to wear during labor but this is optional.  All other supplies are provided by the hospital. Please arrive at the hospital with signs of active labor, and do not wait at home until late in labor. It takes 45 min- 1 hour for fetal monitoring, and check in to your room to take place, plus transport and filling of the waterbirth tub.    Things that would prevent you from having a waterbirth: Premature, <37wks  Previous cesarean birth  Presence of thick meconium-stained fluid  Multiple gestation (Twins, triplets, etc.)  Uncontrolled diabetes or gestational diabetes requiring medication  Hypertension diagnosed in pregnancy or preexisting hypertension (gestational hypertension, preeclampsia, or chronic hypertension) Fetal growth restriction (your baby measures less than 10th percentile on ultrasound) Heavy vaginal bleeding  Non-reassuring fetal heart rate  Active infection (MRSA, etc.). Group B Strep is NOT a contraindication for waterbirth.  If your labor has to be induced and induction method requires continuous monitoring of the baby's heart rate  Other risks/issues identified by your obstetrical provider   Please remember that birth is unpredictable. Under certain unforeseeable circumstances your provider may advise against giving birth in the tub. These decisions will be made on a case-by-case basis and with the safety of you and your baby as our highest priority.    Updated 08/10/21     Contraindications to Waterbirth at [facility]:   History of c-section  Preterm birth less than 37 weeks  Thick, particulate meconium-stained fluid  Maternal fever over 101 degrees Fahrenheit  Heavy bleeding or signs of placental abruption  Pre-eclampsia,   Chronic hypertension or Gestational Hypertension  Any abnormal fetal heart rate pattern  If epidural analgesia is utilized during labor  Malpresentation  Multiple gestation pregnancy  Active communicable disease   Significant limitation to mobility   Preexisting Diabetes, A2 Gestational diabetes or Uncontrolled A1 Gestational diabetes  Any other indication based on medical provider discretion  Special Considerations: Some maternal conditions that may become contraindications by provider discretion are history of seizure or syncope, especially without documentation of management or resolution.  The patient will be required to leave the birthing tub if there is a situation warranting a more complete  fetal assessment, continuous fetal monitoring and/or the necessity for the infant to be delivered outside  the tub.   

## 2022-06-09 LAB — CERVICOVAGINAL ANCILLARY ONLY
Bacterial Vaginitis (gardnerella): NEGATIVE
Candida Glabrata: NEGATIVE
Candida Vaginitis: POSITIVE — AB
Chlamydia: NEGATIVE
Comment: NEGATIVE
Comment: NEGATIVE
Comment: NEGATIVE
Comment: NEGATIVE
Comment: NEGATIVE
Comment: NORMAL
Neisseria Gonorrhea: NEGATIVE
Trichomonas: NEGATIVE

## 2022-06-10 LAB — AFP, SERUM, OPEN SPINA BIFIDA
AFP MoM: 0.95
AFP Value: 29.8 ng/mL
Gest. Age on Collection Date: 16.2 weeks
Maternal Age At EDD: 20.7 yr
OSBR Risk 1 IN: 10000
Test Results:: NEGATIVE
Weight: 208 [lb_av]

## 2022-06-12 ENCOUNTER — Other Ambulatory Visit: Payer: Self-pay

## 2022-06-12 ENCOUNTER — Other Ambulatory Visit: Payer: Self-pay | Admitting: *Deleted

## 2022-06-12 DIAGNOSIS — Z3480 Encounter for supervision of other normal pregnancy, unspecified trimester: Secondary | ICD-10-CM

## 2022-06-12 DIAGNOSIS — B379 Candidiasis, unspecified: Secondary | ICD-10-CM

## 2022-06-12 MED ORDER — TERCONAZOLE 0.4 % VA CREA: 1.0000 | TOPICAL_CREAM | Freq: Every day | VAGINAL | 0 refills | Status: DC

## 2022-06-20 ENCOUNTER — Other Ambulatory Visit: Payer: Self-pay | Admitting: *Deleted

## 2022-06-24 ENCOUNTER — Inpatient Hospital Stay (HOSPITAL_COMMUNITY)
Admission: AD | Admit: 2022-06-24 | Discharge: 2022-06-24 | Disposition: A | Discharge status: Home / Self Care | Payer: Medicaid Other | Attending: Obstetrics and Gynecology | Admitting: Obstetrics and Gynecology

## 2022-06-24 DIAGNOSIS — Z3A18 18 weeks gestation of pregnancy: Secondary | ICD-10-CM | POA: Insufficient documentation

## 2022-06-24 DIAGNOSIS — Z3480 Encounter for supervision of other normal pregnancy, unspecified trimester: Secondary | ICD-10-CM

## 2022-06-24 DIAGNOSIS — M940 Chondrocostal junction syndrome [Tietze]: Secondary | ICD-10-CM | POA: Insufficient documentation

## 2022-06-24 DIAGNOSIS — O26892 Other specified pregnancy related conditions, second trimester: Secondary | ICD-10-CM | POA: Insufficient documentation

## 2022-06-24 DIAGNOSIS — R0789 Other chest pain: Secondary | ICD-10-CM | POA: Diagnosis present

## 2022-06-24 MED ORDER — CYCLOBENZAPRINE HCL 10 MG PO TABS: 10.0000 mg | ORAL_TABLET | Freq: Two times a day (BID) | ORAL | 0 refills | Status: DC | PRN

## 2022-06-24 MED ORDER — ACETAMINOPHEN 500 MG PO TABS
1000.0000 mg | ORAL_TABLET | Freq: Once | ORAL | Status: AC
  Administered 2022-06-24: 1000 mg via ORAL
  Filled 2022-06-24: qty 2

## 2022-06-24 MED ORDER — CYCLOBENZAPRINE HCL 5 MG PO TABS
10.0000 mg | ORAL_TABLET | Freq: Once | ORAL | Status: AC
  Administered 2022-06-24: 10 mg via ORAL
  Filled 2022-06-24: qty 2

## 2022-06-24 NOTE — MAU Provider Note (Signed)
History     CSN: [place]:[id number]  Arrival date and time: 06/24/22 1600   Event Date/Time   First Provider Initiated Contact with Patient 06/24/22 1654      Chief Complaint  Patient presents with   Chest Pain   HPI Sarah Hopkins is a 22 y.o. G1P0 at 29w4dwho presents to MAU for sharp pain in upper right chest. Patient reports it started at noon after she had just finished getting dressed. She denies any injury to the area but notes that movement and touching the area worsens pain. She denies shortness of breath or palpitations. She has not tried anything to relieve pain. She reports that she was recently sick with a cough and stuffy nose. She denies fever or chills.   OB History     Gravida  1   Para  0   Term  0   Preterm  0   AB  0   Living  0      SAB  0   IAB  0   Ectopic  0   Multiple  0   Live Births  0           Past Medical History:  Diagnosis Date   Hyperkalemia     Past Surgical History:  Procedure Laterality Date   NO PAST SURGERIES      Family History  Problem Relation Age of Onset   Hypertension Paternal Grandfather    Kidney failure Paternal Grandfather    Heart Problems Maternal Grandmother    Diabetes Maternal Grandfather     Social History   Tobacco Use   Smoking status: Never   Smokeless tobacco: Never  Vaping Use   Vaping Use: Never used  Substance Use Topics   Alcohol use: No    Comment: social   Drug use: Not Currently    Types: Marijuana    Allergies:  Allergies  Allergen Reactions   Prednisone Anaphylaxis    Other reaction(s): Cramps (ALLERGY/intolerance) Insomnia, severe Depression/moody    Medications Prior to Admission  Medication Sig Dispense Refill Last Dose   aspirin EC 81 MG tablet Take 1 tablet (81 mg total) by mouth daily. Swallow whole. 30 tablet 12    Blood Pressure Monitoring (BLOOD PRESSURE KIT) DEVI 1 Device by Does not apply route once a week. 1 each 0    metroNIDAZOLE (FLAGYL) 500 MG  tablet Take 1 tablet (500 mg total) by mouth 2 (two) times daily. 14 tablet 2    ondansetron (ZOFRAN ODT) 4 MG disintegrating tablet Take 1 tablet (4 mg total) by mouth every 8 (eight) hours as needed. 6 tablet 0    Prenatal Vit-Fe Fumarate-FA (PRENATAL VITAMIN PO) Take 1 tablet by mouth daily.      promethazine (PHENERGAN) 25 MG tablet Take 1 tablet (25 mg total) by mouth every 6 (six) hours as needed for nausea or vomiting. 30 tablet 1    promethazine-dextromethorphan (PROMETHAZINE-DM) 6.25-15 MG/5ML syrup Take 5 mLs by mouth 4 (four) times daily as needed for cough. 118 mL 0    terconazole (TERAZOL 7) 0.4 % vaginal cream Place 1 applicator vaginally at bedtime. 45 g 0    Review of Systems  Constitutional: Negative.   All other systems reviewed and are negative.  Physical Exam   Blood pressure 113/72, pulse 95, temperature 98.6 F (37 C), temperature source Tympanic, resp. rate 15, height 5' 1"$  (1.549 m), weight 94.9 kg, last menstrual period 02/14/2022, SpO2 99 %.  Physical Exam  Vitals and nursing note reviewed.  Constitutional:      General: She is not in acute distress.    Appearance: She is obese.  Eyes:     Extraocular Movements: Extraocular movements intact.     Pupils: Pupils are equal, round, and reactive to light.  Cardiovascular:     Rate and Rhythm: Normal rate and regular rhythm.     Heart sounds: Normal heart sounds.  Pulmonary:     Effort: Pulmonary effort is normal. No tachypnea or respiratory distress.     Breath sounds: Normal breath sounds.  Chest:     Chest wall: Tenderness present. No mass, deformity or crepitus.       Comments: Patient has reproducible pain and tenderness just below clavicle Abdominal:     Palpations: Abdomen is soft.     Tenderness: There is no abdominal tenderness.  Musculoskeletal:        General: Normal range of motion.     Cervical back: Normal range of motion.  Skin:    General: Skin is warm and dry.  Neurological:     General:  No focal deficit present.     Mental Status: She is alert and oriented to person, place, and time.  Psychiatric:        Mood and Affect: Mood normal.        Behavior: Behavior normal.    FHR: 135 bpm via doppler  MAU Course  Procedures  MDM VSS, breath and heart sounds normal. Patient is not in any acute distress. There is reproducible pain just below and along the clavicle. Tylenol and Flexeril were given with improvement in pain. I suspect costochondritis as pain is reproducible and improved with medications. It is also exacerbated by movement/touch to the area. She also has no other cardiopulmonary symptoms. Will send home with Rx for Flexeril. Strict return precautions reviewed at length.   Assessment and Plan   1. Supervision of other normal pregnancy, antepartum   2. [redacted] weeks gestation of pregnancy   3. Costochondritis    - Discharge home in stable condition - Rx for Flexeril - Strict return precautions. Return to MAU as needed for new/worsening symptoms - Keep OB appointment as scheduled  [name] 06/24/2022, 5:10 PM

## 2022-06-24 NOTE — MAU Note (Signed)
...  Sarah Hopkins is a 22 y.o. at 21w4dhere in MAU reporting: Anterior upper right sided chest pain that began around noon today. She reports the pain is constant and at times reaches up to the top of her shoulder and through to her back. She reports [facility] and hoped that it would go away but it did not. She reports moving her arm makes the pain worse and it hurts to touch the area. She reports when she touches the area it feels sharp. She denies VB or LOF. Endorses occasional flutters. Has not felt any today.  Onset of complaint: Noon today Pain score: 8/10   FHT: 135 doppler

## 2022-06-27 ENCOUNTER — Other Ambulatory Visit: Payer: Self-pay

## 2022-06-27 ENCOUNTER — Ambulatory Visit: Payer: Medicaid Other | Attending: Obstetrics and Gynecology

## 2022-06-27 ENCOUNTER — Ambulatory Visit: Payer: Medicaid Other

## 2022-06-27 VITALS — BP 117/64 | HR 89

## 2022-06-27 DIAGNOSIS — Z3480 Encounter for supervision of other normal pregnancy, unspecified trimester: Secondary | ICD-10-CM

## 2022-06-27 DIAGNOSIS — O99212 Obesity complicating pregnancy, second trimester: Secondary | ICD-10-CM | POA: Diagnosis not present

## 2022-06-27 DIAGNOSIS — Z363 Encounter for antenatal screening for malformations: Secondary | ICD-10-CM | POA: Insufficient documentation

## 2022-06-27 DIAGNOSIS — O321XX0 Maternal care for breech presentation, not applicable or unspecified: Secondary | ICD-10-CM | POA: Insufficient documentation

## 2022-06-27 DIAGNOSIS — Z362 Encounter for other antenatal screening follow-up: Secondary | ICD-10-CM

## 2022-06-27 DIAGNOSIS — Z3A19 19 weeks gestation of pregnancy: Secondary | ICD-10-CM | POA: Insufficient documentation

## 2022-07-06 ENCOUNTER — Encounter: Payer: Self-pay | Admitting: Student

## 2022-07-06 ENCOUNTER — Encounter: Payer: Self-pay | Admitting: Obstetrics and Gynecology

## 2022-07-06 ENCOUNTER — Ambulatory Visit (INDEPENDENT_AMBULATORY_CARE_PROVIDER_SITE_OTHER): Payer: Medicaid Other | Admitting: Student

## 2022-07-06 VITALS — BP 126/81 | HR 85 | Wt 213.8 lb

## 2022-07-06 DIAGNOSIS — Z3480 Encounter for supervision of other normal pregnancy, unspecified trimester: Secondary | ICD-10-CM

## 2022-07-06 DIAGNOSIS — M5442 Lumbago with sciatica, left side: Secondary | ICD-10-CM

## 2022-07-06 DIAGNOSIS — Z3402 Encounter for supervision of normal first pregnancy, second trimester: Secondary | ICD-10-CM

## 2022-07-06 DIAGNOSIS — Z3A20 20 weeks gestation of pregnancy: Secondary | ICD-10-CM

## 2022-07-06 NOTE — Progress Notes (Signed)
Pt presents for ROB visit. Pt c/o pain in the tailbone.

## 2022-07-06 NOTE — Progress Notes (Signed)
   PRENATAL VISIT NOTE  Subjective:  Sarah Hopkins is a 22 y.o. G1P0000 at 26w2dbeing seen today for ongoing prenatal care.  She is currently monitored for the following issues for this low-risk pregnancy and has Supervision of other normal pregnancy, antepartum on their problem list.  Patient reports backache and leg pain . Patient reports intermittent back pain that shoots down her left side causing her to stop in her tracks. Patient states that pain is 6-9/10 pain and goes down to a 3/10 with Tylenol. Reports flexeril helps as well, but causes drowsiness. 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   Contractions: Not present. Vag. Bleeding: None.  Movement: Present. Denies leaking of fluid.   The following portions of the patient's history were reviewed and updated as appropriate: allergies, current medications, past family history, past medical history, past social history, past surgical history and problem list.   Objective:   Vitals:   07/06/22 1339  BP: 126/81  Pulse: 85  Weight: 213 lb 12.8 oz (97 kg)    Fetal Status: Fetal Heart Rate (bpm): 151   Movement: Present     General:  Alert, oriented and cooperative. Patient is in no acute distress.  Skin: Skin is warm and dry. No rash noted.   Cardiovascular: Normal heart rate noted  Respiratory: Normal respiratory effort, no problems with respiration noted  Abdomen: Soft, gravid, appropriate for gestational age.  Pain/Pressure: Present     Pelvic: Cervical exam deferred        Extremities: Normal range of motion.  Edema: None  Mental Status: Normal mood and affect. Normal behavior. Normal judgment and thought content.   Assessment and Plan:  Pregnancy: G1P0000 at 247w2d. Supervision of other normal pregnancy, antepartum - Doing well  2. [redacted] weeks gestation of pregnancy - reinforced the benefits of taking daily bASA - Elevated Bps with self check of BP at home. RN  counseled patient on proper measures for taking blood pressure at home. Bps have all been normal in the office.  - Anatomy scan is scheduled  3. Acute left-sided low back pain with left-sided sciatica - Relieved with Tylenol and Flexeril PRN.  - [facility] and advised to incorporate stretches - Patient declines wanting to trial physical therapy   Preterm labor symptoms and general obstetric precautions including but not limited to vaginal bleeding, contractions, leaking of fluid and fetal movement were reviewed in detail with the patient. Please refer to After Visit Summary for other counseling recommendations.   Return in about 4 weeks (around 08/03/2022) for IN-PERSON, LOB.  Future Appointments  Date Time Provider [place]3/27/2024  8:30 AM [facility]/27/2024  8:45 AM [facility] US4 [facility]  NiJohnston EbbsNP

## 2022-07-07 ENCOUNTER — Other Ambulatory Visit: Payer: Self-pay | Admitting: Student

## 2022-07-07 DIAGNOSIS — Z3A20 20 weeks gestation of pregnancy: Secondary | ICD-10-CM

## 2022-07-07 DIAGNOSIS — Z3480 Encounter for supervision of other normal pregnancy, unspecified trimester: Secondary | ICD-10-CM

## 2022-08-02 ENCOUNTER — Encounter: Payer: Self-pay | Admitting: Obstetrics

## 2022-08-02 ENCOUNTER — Other Ambulatory Visit: Payer: Self-pay | Admitting: *Deleted

## 2022-08-02 ENCOUNTER — Ambulatory Visit (INDEPENDENT_AMBULATORY_CARE_PROVIDER_SITE_OTHER): Payer: Medicaid Other | Admitting: Obstetrics

## 2022-08-02 ENCOUNTER — Other Ambulatory Visit (HOSPITAL_COMMUNITY)
Admission: RE | Admit: 2022-08-02 | Discharge: 2022-08-02 | Disposition: A | Discharge status: Home / Self Care | Payer: Medicaid Other | Source: Ambulatory Visit | Attending: Obstetrics | Admitting: Obstetrics

## 2022-08-02 ENCOUNTER — Ambulatory Visit: Payer: Medicaid Other | Attending: Maternal & Fetal Medicine

## 2022-08-02 ENCOUNTER — Other Ambulatory Visit: Payer: Self-pay | Admitting: Maternal & Fetal Medicine

## 2022-08-02 ENCOUNTER — Institutional Professional Consult (permissible substitution): Payer: Medicaid Other | Admitting: Licensed Clinical Social Worker

## 2022-08-02 ENCOUNTER — Ambulatory Visit: Payer: Medicaid Other | Admitting: *Deleted

## 2022-08-02 VITALS — BP 115/69 | HR 88 | Wt 217.0 lb

## 2022-08-02 VITALS — BP 112/53 | HR 82

## 2022-08-02 DIAGNOSIS — Z3A24 24 weeks gestation of pregnancy: Secondary | ICD-10-CM | POA: Diagnosis not present

## 2022-08-02 DIAGNOSIS — Z362 Encounter for other antenatal screening follow-up: Secondary | ICD-10-CM

## 2022-08-02 DIAGNOSIS — O99212 Obesity complicating pregnancy, second trimester: Secondary | ICD-10-CM | POA: Insufficient documentation

## 2022-08-02 DIAGNOSIS — O365990 Maternal care for other known or suspected poor fetal growth, unspecified trimester, not applicable or unspecified: Secondary | ICD-10-CM

## 2022-08-02 DIAGNOSIS — K219 Gastro-esophageal reflux disease without esophagitis: Secondary | ICD-10-CM

## 2022-08-02 DIAGNOSIS — E669 Obesity, unspecified: Secondary | ICD-10-CM | POA: Diagnosis not present

## 2022-08-02 DIAGNOSIS — Z3482 Encounter for supervision of other normal pregnancy, second trimester: Secondary | ICD-10-CM

## 2022-08-02 DIAGNOSIS — J301 Allergic rhinitis due to pollen: Secondary | ICD-10-CM

## 2022-08-02 DIAGNOSIS — O365920 Maternal care for other known or suspected poor fetal growth, second trimester, not applicable or unspecified: Secondary | ICD-10-CM

## 2022-08-02 DIAGNOSIS — Z3480 Encounter for supervision of other normal pregnancy, unspecified trimester: Secondary | ICD-10-CM | POA: Diagnosis present

## 2022-08-02 MED ORDER — PANTOPRAZOLE SODIUM 20 MG PO TBEC: 20.0000 mg | DELAYED_RELEASE_TABLET | Freq: Two times a day (BID) | ORAL | 5 refills | Status: AC

## 2022-08-02 MED ORDER — LORATADINE 10 MG PO TABS: 10.0000 mg | ORAL_TABLET | Freq: Every day | ORAL | 11 refills | Status: AC

## 2022-08-02 NOTE — Progress Notes (Signed)
Subjective:  Sarah Hopkins is a 22 y.o. G1P0000 at [redacted]w[redacted]d being seen today for ongoing prenatal care.  She is currently monitored for the following issues for this low-risk pregnancy and has Supervision of other normal pregnancy, antepartum on their problem list.  Patient reports vaginal irritation.  Contractions: Not present. Vag. Bleeding: None.  Movement: Present. Denies leaking of fluid.   The following portions of the patient's history were reviewed and updated as appropriate: allergies, current medications, past family history, past medical history, past social history, past surgical history and problem list. Problem list updated.  Objective:   Vitals:   08/02/22 1100  BP: 115/69  Pulse: 88  Weight: 217 lb (98.4 kg)    Fetal Status: Fetal Heart Rate (bpm): 147   Movement: Present     General:  Alert, oriented and cooperative. Patient is in no acute distress.  Skin: Skin is warm and dry. No rash noted.   Cardiovascular: Normal heart rate noted  Respiratory: Normal respiratory effort, no problems with respiration noted  Abdomen: Soft, gravid, appropriate for gestational age. Pain/Pressure: Absent     Pelvic:  Cervical exam deferred        Extremities: Normal range of motion.  Edema: None  Mental Status: Normal mood and affect. Normal behavior. Normal judgment and thought content.   Urinalysis:      Assessment and Plan:  Pregnancy: G1P0000 at [redacted]w[redacted]d  1. Supervision of other normal pregnancy, antepartum Rx: - Cervicovaginal ancillary only( [place])  2. GERD without esophagitis Rx: - pantoprazole (PROTONIX) 20 MG tablet; Take 1 tablet (20 mg total) by mouth 2 (two) times daily before a meal.  Dispense: 60 tablet; Refill: 5  3. Seasonal allergic rhinitis due to pollen Rx: - loratadine (CLARITIN) 10 MG tablet; Take 1 tablet (10 mg total) by mouth daily.  Dispense: 30 tablet; Refill: 11   Preterm labor symptoms and general obstetric precautions including but not  limited to vaginal bleeding, contractions, leaking of fluid and fetal movement were reviewed in detail with the patient. Please refer to After Visit Summary for other counseling recommendations.   Return in about 4 weeks (around 08/30/2022) for ROB, 2 hour OGTT.   Shelly Bombard, MD 08/02/22

## 2022-08-02 NOTE — Progress Notes (Signed)
Pt presents for ROB. C/o vaginal itching, desires swab.

## 2022-08-03 LAB — CERVICOVAGINAL ANCILLARY ONLY
Bacterial Vaginitis (gardnerella): NEGATIVE
Candida Glabrata: NEGATIVE
Candida Vaginitis: POSITIVE — AB
Chlamydia: NEGATIVE
Comment: NEGATIVE
Comment: NEGATIVE
Comment: NEGATIVE
Comment: NEGATIVE
Comment: NEGATIVE
Comment: NORMAL
Neisseria Gonorrhea: NEGATIVE
Trichomonas: NEGATIVE

## 2022-08-07 ENCOUNTER — Encounter: Payer: Self-pay | Admitting: Obstetrics

## 2022-08-07 ENCOUNTER — Other Ambulatory Visit: Payer: Self-pay | Admitting: Obstetrics

## 2022-08-07 DIAGNOSIS — B3731 Acute candidiasis of vulva and vagina: Secondary | ICD-10-CM

## 2022-08-07 MED ORDER — TERCONAZOLE 0.8 % VA CREA
1.0000 | TOPICAL_CREAM | Freq: Every day | VAGINAL | 0 refills | Status: DC
Start: 2022-08-07 — End: 2022-08-30

## 2022-08-13 ENCOUNTER — Inpatient Hospital Stay (HOSPITAL_COMMUNITY)
Admission: AD | Admit: 2022-08-13 | Discharge: 2022-08-13 | Disposition: A | Discharge status: Home / Self Care | Payer: Medicaid Other | Attending: Obstetrics & Gynecology | Admitting: Obstetrics & Gynecology

## 2022-08-13 ENCOUNTER — Other Ambulatory Visit: Payer: Self-pay

## 2022-08-13 ENCOUNTER — Encounter (HOSPITAL_COMMUNITY): Payer: Self-pay | Admitting: Obstetrics & Gynecology

## 2022-08-13 DIAGNOSIS — Z3A25 25 weeks gestation of pregnancy: Secondary | ICD-10-CM | POA: Insufficient documentation

## 2022-08-13 DIAGNOSIS — O26892 Other specified pregnancy related conditions, second trimester: Secondary | ICD-10-CM | POA: Insufficient documentation

## 2022-08-13 DIAGNOSIS — R141 Gas pain: Secondary | ICD-10-CM

## 2022-08-13 LAB — URINALYSIS, ROUTINE W REFLEX MICROSCOPIC
Bacteria, UA: NONE SEEN
Bilirubin Urine: NEGATIVE
Glucose, UA: NEGATIVE mg/dL
Hgb urine dipstick: NEGATIVE
Ketones, ur: NEGATIVE mg/dL
Leukocytes,Ua: NEGATIVE
Nitrite: NEGATIVE
Protein, ur: NEGATIVE mg/dL
Specific Gravity, Urine: 1.005 (ref 1.005–1.030)
pH: 7 (ref 5.0–8.0)

## 2022-08-13 MED ORDER — ACETAMINOPHEN 500 MG PO TABS
1000.0000 mg | ORAL_TABLET | Freq: Once | ORAL | Status: AC
  Administered 2022-08-13: 1000 mg via ORAL
  Filled 2022-08-13: qty 2

## 2022-08-13 MED ORDER — SIMETHICONE 80 MG PO CHEW
80.0000 mg | CHEWABLE_TABLET | Freq: Once | ORAL | Status: AC
  Administered 2022-08-13: 80 mg via ORAL
  Filled 2022-08-13: qty 1

## 2022-08-13 NOTE — MAU Note (Signed)
.  Sarah Hopkins is a 22 y.o. at [redacted]w[redacted]d here in MAU reporting: stomach is hurting - unable to describe pain "not cramping, not sharp pain" "uncomfortable" since 2300. Tried to use bathroom, took a bath - nothing helped. States feels pain all over abdomen, but it is mostly left sided. Denies VB or LOF. +FM   Onset of complaint: 2300 Pain score: 7 - constant  Vitals:   08/13/22 0229  BP: 115/64  Pulse: 88  Resp: 17  Temp: 98.4 F (36.9 C)  SpO2: 99%     FHT:143 Lab orders placed from triage:  UA

## 2022-08-13 NOTE — Discharge Instructions (Signed)

## 2022-08-13 NOTE — MAU Provider Note (Signed)
History     [id number]  Arrival date and time: 08/13/22 0211    Chief Complaint  Patient presents with   Abdominal Pain     HPI Sarah Hopkins is a 22 y.o. at [redacted]w[redacted]d by LMP who presents for abdominal pain. Symptoms started at 11 pm. Reports constant pain in left side of abdomen. Nothing makes better or worse. Hasn't treated symptoms. Denies fever, n/v/d, constipation, dysuria, vaginal bleeding, or LOF. Good fetal movement. Ate cup of noodles (not spicy) before lying down last night. Last BM was yesterday.   A/Positive/-- (01/04 1409)  OB History     Gravida  1   Para  0   Term  0   Preterm  0   AB  0   Living  0      SAB  0   IAB  0   Ectopic  0   Multiple  0   Live Births  0           Past Medical History:  Diagnosis Date   Medical history non-contributory     Past Surgical History:  Procedure Laterality Date   NO PAST SURGERIES      Family History  Problem Relation Age of Onset   Heart Problems Maternal Grandmother    Diabetes Maternal Grandfather    Hypertension Paternal Grandfather    Kidney failure Paternal Grandfather    Asthma Neg Hx    Stroke Neg Hx     Social History   Socioeconomic History   Marital status: Single    Spouse name: Not on file   Number of children: Not on file   Years of education: Not on file   Highest education level: Not on file  Occupational History   Not on file  Tobacco Use   Smoking status: Never   Smokeless tobacco: Never  Vaping Use   Vaping Use: Never used  Substance and Sexual Activity   Alcohol use: No    Comment: social   Drug use: Not Currently    Types: Marijuana   Sexual activity: Yes    Partners: Male    Birth control/protection: None  Other Topics Concern   Not on file  Social History Narrative   Not on file   Social Determinants of Health   Financial Resource Strain: Not on file  Food Insecurity: Not on file  Transportation Needs: Not on file  Physical Activity: Not on  file  Stress: Not on file  Social Connections: Not on file  Intimate Partner Violence: Not on file    Allergies  Allergen Reactions   Prednisone Anaphylaxis    Other reaction(s): Cramps (ALLERGY/intolerance) Insomnia, severe Depression/moody    No current facility-administered medications on file prior to encounter.   Current Outpatient Medications on File Prior to Encounter  Medication Sig Dispense Refill   aspirin EC 81 MG tablet Take 1 tablet (81 mg total) by mouth daily. Swallow whole. 30 tablet 12   loratadine (CLARITIN) 10 MG tablet Take 1 tablet (10 mg total) by mouth daily. 30 tablet 11   pantoprazole (PROTONIX) 20 MG tablet Take 1 tablet (20 mg total) by mouth 2 (two) times daily before a meal. 60 tablet 5   Prenatal Vit-Fe Fumarate-FA (PRENATAL VITAMIN PO) Take 1 tablet by mouth daily.     terconazole (TERAZOL 3) 0.8 % vaginal cream Place 1 applicator vaginally at bedtime. 20 g 0   Blood Pressure Monitoring (BLOOD PRESSURE KIT) DEVI 1 Device by Does not  apply route once a week. (Patient not taking: Reported on 08/02/2022) 1 each 0   cyclobenzaprine (FLEXERIL) 10 MG tablet Take 1 tablet (10 mg total) by mouth 2 (two) times daily as needed for muscle spasms. (Patient not taking: Reported on 08/02/2022) 20 tablet 0     ROS Pertinent positives and negative per HPI, all others reviewed and negative  Physical Exam   BP (!) 123/58 (BP Location: Right Arm)   Pulse 88   Temp 98.4 F (36.9 C) (Oral)   Resp 17   Ht 5\' 1"  (1.549 m)   Wt 100.2 kg   LMP 02/14/2022 (Exact Date)   SpO2 99%   BMI 41.72 kg/m   Patient Vitals for the past 24 hrs:  BP Temp Temp src Pulse Resp SpO2 Height Weight  08/13/22 0400 (!) 123/58 -- -- 88 17 99 % -- --  08/13/22 0350 -- -- -- -- -- 99 % -- --  08/13/22 0340 -- -- -- -- -- 99 % -- --  08/13/22 0330 -- -- -- -- -- 99 % -- --  08/13/22 0320 -- -- -- -- -- 100 % -- --  08/13/22 0310 -- -- -- -- -- 100 % -- --  08/13/22 0300 -- -- -- -- --  98 % -- --  08/13/22 0250 119/68 -- -- 92 -- 98 % -- --  08/13/22 0229 115/64 98.4 F (36.9 C) Oral 88 17 99 % 5\' 1"  (1.549 m) 100.2 kg    Physical Exam Vitals and nursing note reviewed.  Constitutional:      General: She is not in acute distress.    Appearance: She is well-developed. She is not ill-appearing.  HENT:     Head: Normocephalic and atraumatic.  Eyes:     General: No scleral icterus.       Right eye: No discharge.        Left eye: No discharge.     Conjunctiva/sclera: Conjunctivae normal.  Pulmonary:     Effort: Pulmonary effort is normal. No respiratory distress.  Abdominal:     Palpations: Abdomen is soft.     Tenderness: There is no abdominal tenderness. There is no right CVA tenderness, left CVA tenderness, guarding or rebound.     Comments: Gravid  Skin:    General: Skin is warm and dry.  Neurological:     General: No focal deficit present.     Mental Status: She is alert.  Psychiatric:        Mood and Affect: Mood normal.        Behavior: Behavior normal.       FHT Baseline 140, moderate variability, 10x10 accels, variable decel x1 Toco: flat Cat: 2  Labs Results for orders placed or performed during the hospital encounter of 08/13/22 (from the past 24 hour(s))  Urinalysis, Routine w reflex microscopic -Urine, Clean Catch     Status: None   Collection Time: 08/13/22  2:41 AM  Result Value Ref Range   Color, Urine YELLOW YELLOW   APPearance CLEAR CLEAR   Specific Gravity, Urine 1.005 1.005 - 1.030   pH 7.0 5.0 - 8.0   Glucose, UA NEGATIVE NEGATIVE mg/dL   Hgb urine dipstick NEGATIVE NEGATIVE   Bilirubin Urine NEGATIVE NEGATIVE   Ketones, ur NEGATIVE NEGATIVE mg/dL   Protein, ur NEGATIVE NEGATIVE mg/dL   Nitrite NEGATIVE NEGATIVE   Leukocytes,Ua NEGATIVE NEGATIVE   RBC / HPF 0-5 0 - 5 RBC/hpf   WBC, UA 0-5 0 - 5  WBC/hpf   Bacteria, UA NONE SEEN NONE SEEN   Squamous Epithelial / HPF 0-5 0 - 5 /HPF    Imaging No results found.  MAU  Course  Procedures Lab Orders         Urinalysis, Routine w reflex microscopic -Urine, Clean Catch    Meds ordered this encounter  Medications   acetaminophen (TYLENOL) tablet 1,000 mg   simethicone (MYLICON) chewable tablet 80 mg   Imaging Orders  No imaging studies ordered today    MDM moderate  Assessment and Plan   1. Gas pain   2. [redacted] weeks gestation of pregnancy    -Patient reports left sided abdominal pain since eating noodle earlier tonight. Symptoms treated in MAU with tylenol & simethicone. Declines cervical exam but low suspicion for preterm labor given flat toco & soft abdomen. Patient states she will return of pain worsens & would like to take OTC meds at home. Has appointment with her OB later this week.   #FWB: appropriate for gestational age    Dispo: discharged to home in stable condition.   Discharge Instructions     Discharge patient   Complete by: As directed    Discharge disposition: 01-Home or Self Care   Discharge patient date: 08/13/2022       Judeth HornErin Jaedan Huttner, NP 08/13/22 4:40 AM  Allergies as of 08/13/2022       Reactions   Prednisone Anaphylaxis   Other reaction(s): Cramps (ALLERGY/intolerance) Insomnia, severe Depression/moody        Medication List     TAKE these medications    aspirin EC 81 MG tablet Take 1 tablet (81 mg total) by mouth daily. Swallow whole.   Blood Pressure Kit Devi 1 Device by Does not apply route once a week.   cyclobenzaprine 10 MG tablet Commonly known as: FLEXERIL Take 1 tablet (10 mg total) by mouth 2 (two) times daily as needed for muscle spasms.   loratadine 10 MG tablet Commonly known as: Claritin Take 1 tablet (10 mg total) by mouth daily.   pantoprazole 20 MG tablet Commonly known as: Protonix Take 1 tablet (20 mg total) by mouth 2 (two) times daily before a meal.   PRENATAL VITAMIN PO Take 1 tablet by mouth daily.   terconazole 0.8 % vaginal cream Commonly known as: TERAZOL 3 Place 1  applicator vaginally at bedtime.

## 2022-08-16 ENCOUNTER — Ambulatory Visit: Payer: Medicaid Other | Attending: Maternal & Fetal Medicine

## 2022-08-16 ENCOUNTER — Ambulatory Visit: Payer: Medicaid Other | Admitting: *Deleted

## 2022-08-16 VITALS — BP 105/57 | HR 89

## 2022-08-16 DIAGNOSIS — O365990 Maternal care for other known or suspected poor fetal growth, unspecified trimester, not applicable or unspecified: Secondary | ICD-10-CM | POA: Diagnosis not present

## 2022-08-16 DIAGNOSIS — O365920 Maternal care for other known or suspected poor fetal growth, second trimester, not applicable or unspecified: Secondary | ICD-10-CM | POA: Diagnosis present

## 2022-08-16 DIAGNOSIS — Z3A26 26 weeks gestation of pregnancy: Secondary | ICD-10-CM | POA: Diagnosis not present

## 2022-08-16 DIAGNOSIS — O99212 Obesity complicating pregnancy, second trimester: Secondary | ICD-10-CM

## 2022-08-16 DIAGNOSIS — E669 Obesity, unspecified: Secondary | ICD-10-CM | POA: Diagnosis not present

## 2022-08-30 ENCOUNTER — Ambulatory Visit (INDEPENDENT_AMBULATORY_CARE_PROVIDER_SITE_OTHER): Payer: Medicaid Other | Admitting: Obstetrics

## 2022-08-30 ENCOUNTER — Ambulatory Visit: Payer: Medicaid Other | Attending: Maternal & Fetal Medicine

## 2022-08-30 ENCOUNTER — Other Ambulatory Visit: Payer: Medicaid Other

## 2022-08-30 ENCOUNTER — Ambulatory Visit: Payer: Medicaid Other

## 2022-08-30 ENCOUNTER — Encounter: Payer: Self-pay | Admitting: Obstetrics

## 2022-08-30 ENCOUNTER — Other Ambulatory Visit: Payer: Self-pay | Admitting: *Deleted

## 2022-08-30 ENCOUNTER — Ambulatory Visit: Payer: Medicaid Other | Admitting: *Deleted

## 2022-08-30 VITALS — BP 115/71 | HR 89 | Wt 222.0 lb

## 2022-08-30 VITALS — BP 118/54 | HR 81

## 2022-08-30 DIAGNOSIS — E669 Obesity, unspecified: Secondary | ICD-10-CM | POA: Diagnosis not present

## 2022-08-30 DIAGNOSIS — Z3A28 28 weeks gestation of pregnancy: Secondary | ICD-10-CM

## 2022-08-30 DIAGNOSIS — O365990 Maternal care for other known or suspected poor fetal growth, unspecified trimester, not applicable or unspecified: Secondary | ICD-10-CM | POA: Insufficient documentation

## 2022-08-30 DIAGNOSIS — O99213 Obesity complicating pregnancy, third trimester: Secondary | ICD-10-CM

## 2022-08-30 DIAGNOSIS — Z3483 Encounter for supervision of other normal pregnancy, third trimester: Secondary | ICD-10-CM

## 2022-08-30 DIAGNOSIS — O365930 Maternal care for other known or suspected poor fetal growth, third trimester, not applicable or unspecified: Secondary | ICD-10-CM

## 2022-08-30 DIAGNOSIS — Z23 Encounter for immunization: Secondary | ICD-10-CM | POA: Diagnosis not present

## 2022-08-30 DIAGNOSIS — Z3689 Encounter for other specified antenatal screening: Secondary | ICD-10-CM

## 2022-08-30 DIAGNOSIS — Z3480 Encounter for supervision of other normal pregnancy, unspecified trimester: Secondary | ICD-10-CM

## 2022-08-30 NOTE — Progress Notes (Signed)
Subjective:  Sarah Hopkins is a 22 y.o. G1P0000 at [redacted]w[redacted]d being seen today for ongoing prenatal care.  She is currently monitored for the following issues for this low-risk pregnancy and has Supervision of other normal pregnancy, antepartum on their problem list.  Patient reports no complaints.  Contractions: Not present. Vag. Bleeding: None.  Movement: Present. Denies leaking of fluid.   The following portions of the patient's history were reviewed and updated as appropriate: allergies, current medications, past family history, past medical history, past social history, past surgical history and problem list. Problem list updated.  Objective:   Vitals:   08/30/22 0947  BP: 115/71  Pulse: 89  Weight: 222 lb (100.7 kg)    Fetal Status:     Movement: Present     General:  Alert, oriented and cooperative. Patient is in no acute distress.  Skin: Skin is warm and dry. No rash noted.   Cardiovascular: Normal heart rate noted  Respiratory: Normal respiratory effort, no problems with respiration noted  Abdomen: Soft, gravid, appropriate for gestational age. Pain/Pressure: Present     Pelvic:  Cervical exam deferred        Extremities: Normal range of motion.  Edema: None  Mental Status: Normal mood and affect. Normal behavior. Normal judgment and thought content.   Urinalysis:      Assessment and Plan:  Pregnancy: G1P0000 at [redacted]w[redacted]d  1. Supervision of other normal pregnancy, antepartum Rx: - Glucose Tolerance, 2 Hours w/1 Hour - RPR - CBC - HIV Antibody (routine testing w rflx)  Preterm labor symptoms and general obstetric precautions including but not limited to vaginal bleeding, contractions, leaking of fluid and fetal movement were reviewed in detail with the patient. Please refer to After Visit Summary for other counseling recommendations.   Return in about 2 weeks (around 09/13/2022) for ROB.   Brock Bad, MD 08/30/22

## 2022-08-31 LAB — GLUCOSE TOLERANCE, 2 HOURS W/ 1HR
Glucose, 1 hour: 131 mg/dL (ref 70–179)
Glucose, 2 hour: 118 mg/dL (ref 70–152)
Glucose, Fasting: 71 mg/dL (ref 70–91)

## 2022-08-31 LAB — CBC
Hematocrit: 32.3 % — ABNORMAL LOW (ref 34.0–46.6)
Hemoglobin: 10.7 g/dL — ABNORMAL LOW (ref 11.1–15.9)
MCH: 28.8 pg (ref 26.6–33.0)
MCHC: 33.1 g/dL (ref 31.5–35.7)
MCV: 87 fL (ref 79–97)
Platelets: 238 10*3/uL (ref 150–450)
RBC: 3.72 x10E6/uL — ABNORMAL LOW (ref 3.77–5.28)
RDW: 12.4 % (ref 11.7–15.4)
WBC: 7.2 10*3/uL (ref 3.4–10.8)

## 2022-08-31 LAB — RPR: RPR Ser Ql: NONREACTIVE

## 2022-08-31 LAB — HIV ANTIBODY (ROUTINE TESTING W REFLEX): HIV Screen 4th Generation wRfx: NONREACTIVE

## 2022-09-01 ENCOUNTER — Other Ambulatory Visit: Payer: Medicaid Other

## 2022-09-01 ENCOUNTER — Other Ambulatory Visit: Payer: Self-pay | Admitting: Obstetrics

## 2022-09-01 DIAGNOSIS — D508 Other iron deficiency anemias: Secondary | ICD-10-CM

## 2022-09-01 MED ORDER — ACCRUFER 30 MG PO CAPS
1.0000 | ORAL_CAPSULE | Freq: Two times a day (BID) | ORAL | 3 refills | Status: DC
Start: 2022-09-01 — End: 2022-09-01

## 2022-09-01 MED ORDER — ACCRUFER 30 MG PO CAPS
1.0000 | ORAL_CAPSULE | Freq: Two times a day (BID) | ORAL | 3 refills | Status: DC
Start: 2022-09-01 — End: 2022-11-29

## 2022-09-04 ENCOUNTER — Other Ambulatory Visit: Payer: Self-pay

## 2022-09-04 DIAGNOSIS — B379 Candidiasis, unspecified: Secondary | ICD-10-CM

## 2022-09-04 MED ORDER — TERCONAZOLE 0.4 % VA CREA
1.0000 | TOPICAL_CREAM | Freq: Every day | VAGINAL | 0 refills | Status: DC
Start: 2022-09-04 — End: 2022-10-09

## 2022-09-18 ENCOUNTER — Encounter: Payer: Self-pay | Admitting: Advanced Practice Midwife

## 2022-09-18 ENCOUNTER — Ambulatory Visit (INDEPENDENT_AMBULATORY_CARE_PROVIDER_SITE_OTHER): Payer: Medicaid Other | Admitting: Advanced Practice Midwife

## 2022-09-18 ENCOUNTER — Encounter: Payer: Medicaid Other | Admitting: Advanced Practice Midwife

## 2022-09-18 VITALS — BP 128/75 | HR 88 | Wt 223.0 lb

## 2022-09-18 DIAGNOSIS — Z3A30 30 weeks gestation of pregnancy: Secondary | ICD-10-CM

## 2022-09-18 DIAGNOSIS — Z3480 Encounter for supervision of other normal pregnancy, unspecified trimester: Secondary | ICD-10-CM

## 2022-09-18 NOTE — Progress Notes (Signed)
   PRENATAL VISIT NOTE  Subjective:  Sarah Hopkins is a 22 y.o. G1P0000 at [redacted]w[redacted]d being seen today for ongoing prenatal care.  She is currently monitored for the following issues for this low-risk pregnancy and has Supervision of other normal pregnancy, antepartum on their problem list.  Patient reports no complaints.  Contractions: Not present. Vag. Bleeding: None.  Movement: Present. Denies leaking of fluid.   The following portions of the patient's history were reviewed and updated as appropriate: allergies, current medications, past family history, past medical history, past social history, past surgical history and problem list.   Objective:   Vitals:   09/18/22 1346  BP: 128/75  Pulse: 88  Weight: 223 lb (101.2 kg)    Fetal Status: Fetal Heart Rate (bpm): 136 Fundal Height: 31 cm Movement: Present     General:  Alert, oriented and cooperative. Patient is in no acute distress.  Skin: Skin is warm and dry. No rash noted.   Cardiovascular: Normal heart rate noted  Respiratory: Normal respiratory effort, no problems with respiration noted  Abdomen: Soft, gravid, appropriate for gestational age.  Pain/Pressure: Present     Pelvic: Cervical exam deferred        Extremities: Normal range of motion.  Edema: None  Mental Status: Normal mood and affect. Normal behavior. Normal judgment and thought content.   Assessment and Plan:  Pregnancy: G1P0000 at [redacted]w[redacted]d 1. Supervision of other normal pregnancy, antepartum --Anticipatory guidance about next visits/weeks of pregnancy given.  - Pt is interested in waterbirth.  No contraindications at this time per chart review/patient assessment.   - Pt has taken class, will bring certificate or send via MyChart, see CNMs for most visits in the office.  - Discussed waterbirth as option for low-risk pregnancy.  Reviewed conditions that may arise during pregnancy that will risk pt out of waterbirth including hypertension, diabetes, fetal growth  restriction <10%ile, etc.   2. [redacted] weeks gestation of pregnancy   Preterm labor symptoms and general obstetric precautions including but not limited to vaginal bleeding, contractions, leaking of fluid and fetal movement were reviewed in detail with the patient. Please refer to After Visit Summary for other counseling recommendations.   Return in about 2 weeks (around 10/02/2022) for Midwife preferred, LOB, As scheduled.  Future Appointments  Date Time Provider Department Center  09/21/2022  2:45 PM [facility] US6 [facility]  09/25/2022  2:30 PM Leftwich-Kirby, Wilmer Floor, CNM [facility] None  10/10/2022  2:30 PM Leftwich-Kirby, Wilmer Floor, CNM [facility] None  10/24/2022 11:15 AM Leftwich-Kirby, Wilmer Floor, CNM [facility] None  10/31/2022  2:30 PM Carlynn Herald, CNM [facility] None    Sharen Counter, CNM

## 2022-09-18 NOTE — Progress Notes (Signed)
Pt presents for ROB visit. Pt c.o nasal congestion for 2 months. Claritin not helping. Pt has concerns about dating from [place] on 08-30-22. No other concerns.

## 2022-09-21 ENCOUNTER — Ambulatory Visit: Payer: Medicaid Other | Attending: Maternal & Fetal Medicine

## 2022-09-21 ENCOUNTER — Other Ambulatory Visit: Payer: Self-pay

## 2022-09-21 DIAGNOSIS — O99213 Obesity complicating pregnancy, third trimester: Secondary | ICD-10-CM | POA: Diagnosis not present

## 2022-09-21 DIAGNOSIS — Z3A31 31 weeks gestation of pregnancy: Secondary | ICD-10-CM

## 2022-09-21 DIAGNOSIS — Z3689 Encounter for other specified antenatal screening: Secondary | ICD-10-CM | POA: Insufficient documentation

## 2022-09-21 DIAGNOSIS — E669 Obesity, unspecified: Secondary | ICD-10-CM

## 2022-09-25 ENCOUNTER — Encounter: Payer: Medicaid Other | Admitting: Advanced Practice Midwife

## 2022-09-25 NOTE — Progress Notes (Deleted)
   PRENATAL VISIT NOTE  Subjective:  Sarah Hopkins is a 22 y.o. G1P0000 at [redacted]w[redacted]d being seen today for ongoing prenatal care.  She is currently monitored for the following issues for this {Blank single:19197::"high-risk","low-risk"} pregnancy and has Supervision of other normal pregnancy, antepartum on their problem list.  Patient reports {sx:14538}.   .  .   . Denies leaking of fluid.   The following portions of the patient's history were reviewed and updated as appropriate: allergies, current medications, past family history, past medical history, past social history, past surgical history and problem list.   Objective:  There were no vitals filed for this visit.  Fetal Status:           General:  Alert, oriented and cooperative. Patient is in no acute distress.  Skin: Skin is warm and dry. No rash noted.   Cardiovascular: Normal heart rate noted  Respiratory: Normal respiratory effort, no problems with respiration noted  Abdomen: Soft, gravid, appropriate for gestational age.        Pelvic: {Blank single:19197::"Cervical exam performed in the presence of a chaperone","Cervical exam deferred"}        Extremities: Normal range of motion.     Mental Status: Normal mood and affect. Normal behavior. Normal judgment and thought content.   Assessment and Plan:  Pregnancy: G1P0000 at [redacted]w[redacted]d 1. Supervision of other normal pregnancy, antepartum ***  2. Iron deficiency anemia secondary to inadequate dietary iron intake ***  3. [redacted] weeks gestation of pregnancy ***  {Blank single:19197::"Term","Preterm"} labor symptoms and general obstetric precautions including but not limited to vaginal bleeding, contractions, leaking of fluid and fetal movement were reviewed in detail with the patient. Please refer to After Visit Summary for other counseling recommendations.   No follow-ups on file.  Future Appointments  Date Time Provider Department Center  09/25/2022  2:30 PM Hurshel Party, CNM [facility] None  10/10/2022 10:30 AM [facility] NURSE [facility]  10/10/2022 10:45 AM [facility] NST [facility]  10/10/2022  2:30 PM Leftwich-Kirby, Angalina Ante A, CNM [facility] None  10/17/2022  2:15 PM [facility] NURSE [facility]  10/17/2022  2:30 PM [facility] US3 [facility]  10/24/2022  7:15 AM [facility] NURSE [facility]  10/24/2022  7:30 AM [facility] US2 [facility]  10/24/2022 11:15 AM Leftwich-Kirby, Wilmer Floor, CNM [facility] None  10/31/2022 12:30 PM [facility] NURSE [facility]  10/31/2022 12:45 PM [facility] US5 [facility]  10/31/2022  2:30 PM Carlynn Herald, CNM [facility] None    Sharen Counter, CNM

## 2022-09-28 ENCOUNTER — Telehealth: Payer: Self-pay

## 2022-09-28 ENCOUNTER — Inpatient Hospital Stay (HOSPITAL_COMMUNITY)
Admission: AD | Admit: 2022-09-28 | Discharge: 2022-09-28 | Disposition: A | Discharge status: Home / Self Care | Payer: Medicaid Other | Attending: Obstetrics & Gynecology | Admitting: Obstetrics & Gynecology

## 2022-09-28 ENCOUNTER — Encounter (HOSPITAL_COMMUNITY): Payer: Self-pay | Admitting: Obstetrics & Gynecology

## 2022-09-28 DIAGNOSIS — Z3A32 32 weeks gestation of pregnancy: Secondary | ICD-10-CM | POA: Diagnosis not present

## 2022-09-28 DIAGNOSIS — Z3480 Encounter for supervision of other normal pregnancy, unspecified trimester: Secondary | ICD-10-CM

## 2022-09-28 DIAGNOSIS — O26853 Spotting complicating pregnancy, third trimester: Secondary | ICD-10-CM | POA: Insufficient documentation

## 2022-09-28 DIAGNOSIS — O4693 Antepartum hemorrhage, unspecified, third trimester: Secondary | ICD-10-CM | POA: Diagnosis present

## 2022-09-28 HISTORY — DX: Anemia, unspecified: D64.9

## 2022-09-28 LAB — WET PREP, GENITAL
Clue Cells Wet Prep HPF POC: NONE SEEN
Sperm: NONE SEEN
Trich, Wet Prep: NONE SEEN
WBC, Wet Prep HPF POC: >=10 — AB (ref <10)
Yeast Wet Prep HPF POC: NONE SEEN

## 2022-09-28 LAB — URINALYSIS, ROUTINE W REFLEX MICROSCOPIC
Bilirubin Urine: NEGATIVE
Glucose, UA: NEGATIVE mg/dL
Hgb urine dipstick: NEGATIVE
Ketones, ur: NEGATIVE mg/dL
Leukocytes,Ua: NEGATIVE
Nitrite: NEGATIVE
Protein, ur: NEGATIVE mg/dL
Specific Gravity, Urine: 1.01 (ref 1.005–1.030)
pH: 8 (ref 5.0–8.0)

## 2022-09-28 NOTE — Telephone Encounter (Signed)
PT called to informed she is bleeding and in her 3rd trimester. Pt instructed to go to MAU for evaluation.

## 2022-09-28 NOTE — Discharge Instructions (Signed)
Return to MAU: °If you have heavier bleeding that soaks through more that 2 pads per hour for an hour or more °If you bleed so much that you feel like you might pass out or you do pass out °If you have significant abdominal pain that is not improved with Tylenol 1000 mg every 8 hours as needed for pain °If you develop a fever > 100.5 °

## 2022-09-28 NOTE — MAU Note (Signed)
.  Sarah Hopkins is a 22 y.o. at [redacted]w[redacted]d here in MAU reporting: she had some cramping yesterday none today. Woke up this morning with some spotting when she wiped. Deneis any recent intercourse or exam   Onset of complaint: today Pain score: 0 Vitals:   09/28/22 0904  BP: 117/71  Pulse: 95  Resp: 18  Temp: 98.3 F (36.8 C)     FHT:135 Lab orders placed from triage:  u/a

## 2022-09-28 NOTE — MAU Provider Note (Signed)
History     CSN: [id number]  Arrival date and time: 09/28/22 6295   Event Date/Time   First Provider Initiated Contact with Patient 09/28/22 [phone]      Chief Complaint  Patient presents with   Vaginal Bleeding   HPI [name] is a 22 y.o. year old G85P0000 female at [redacted]w[redacted]d weeks gestation who presents to MAU reporting cramping yesterday 09/27/2022, but none today. When she woke up this morning she noticed spotting when she wiped. She reports the last SI she had was 2-3 days ago. She has not had any recent vaginal exams either. She denies any urinary problems. She does not think the spotting was from her urinary system. She is convinced it was vaginal in source. She reports (+) FM. She receives [facility] with [facility]; next appt is 10/10/2022. Her significant other is present and contributing to the history taking.     OB History     Gravida  1   Para  0   Term  0   Preterm  0   AB  0   Living  0      SAB  0   IAB  0   Ectopic  0   Multiple  0   Live Births  0           Past Medical History:  Diagnosis Date   Anemia    Medical history non-contributory     Past Surgical History:  Procedure Laterality Date   NO PAST SURGERIES      Family History  Problem Relation Age of Onset   Heart Problems Maternal Grandmother    Diabetes Maternal Grandfather    Hypertension Paternal Grandfather    Kidney failure Paternal Grandfather    Asthma Neg Hx    Stroke Neg Hx     Social History   Tobacco Use   Smoking status: Never   Smokeless tobacco: Never  Vaping Use   Vaping Use: Never used  Substance Use Topics   Alcohol use: No    Comment: social   Drug use: Not Currently    Types: Marijuana    Allergies:  Allergies  Allergen Reactions   Prednisone Anaphylaxis    Other reaction(s): Cramps (ALLERGY/intolerance) Insomnia, severe Depression/moody    Medications Prior to Admission  Medication Sig Dispense Refill Last Dose   aspirin EC 81 MG tablet  Take 1 tablet (81 mg total) by mouth daily. Swallow whole. 30 tablet 12 09/28/2022   Ferric Maltol (ACCRUFER) 30 MG CAPS Take 1 capsule (30 mg total) by mouth 2 (two) times daily before a meal. Take 2 hrs before, or 2 hrs after a meal. 60 capsule 3 09/28/2022   loratadine (CLARITIN) 10 MG tablet Take 1 tablet (10 mg total) by mouth daily. 30 tablet 11 Past Month   pantoprazole (PROTONIX) 20 MG tablet Take 1 tablet (20 mg total) by mouth 2 (two) times daily before a meal. 60 tablet 5 09/28/2022   Prenatal Vit-Fe Fumarate-FA (PRENATAL VITAMIN PO) Take 1 tablet by mouth daily.   09/28/2022   terconazole (TERAZOL 7) 0.4 % vaginal cream Place 1 applicator vaginally at bedtime. (Patient not taking: Reported on 09/18/2022) 45 g 0    Blood Pressure Monitoring (BLOOD PRESSURE KIT) DEVI 1 Device by Does not apply route once a week. 1 each 0    cyclobenzaprine (FLEXERIL) 10 MG tablet Take 1 tablet (10 mg total) by mouth 2 (two) times daily as needed for muscle spasms. (Patient not  taking: Reported on 08/02/2022) 20 tablet 0     Review of Systems  Constitutional: Negative.   HENT: Negative.    Eyes: Negative.   Respiratory: Negative.    Cardiovascular: Negative.   Gastrointestinal: Negative.   Endocrine: Negative.   Genitourinary:  Positive for vaginal bleeding (spotting with wiping).  Musculoskeletal: Negative.   Skin: Negative.   Allergic/Immunologic: Negative.   Neurological: Negative.   Hematological: Negative.   Psychiatric/Behavioral: Negative.     Physical Exam   Blood pressure 117/71, pulse 95, temperature 98.3 F (36.8 C), resp. rate 18, height 5\' 1"  (1.549 m), weight 102.1 kg, last menstrual period 02/14/2022.  Physical Exam Vitals and nursing note reviewed. Exam conducted with a chaperone present.  Constitutional:      Appearance: Normal appearance. She is obese.  Cardiovascular:     Rate and Rhythm: Normal rate.  Pulmonary:     Effort: Pulmonary effort is normal.  Abdominal:      Palpations: Abdomen is soft.  Genitourinary:    General: Normal vulva.     Comments: Pelvic exam: External genitalia normal, SE: vaginal walls pink and well rugated, cervix is smooth, pink, no lesions, small amt of thick, white vaginal d/c; NO evidence of bleeding past or present -- WP, GC/CT done, cervix visually closed. Musculoskeletal:        General: Normal range of motion.  Skin:    General: Skin is warm and dry.  Neurological:     Mental Status: She is alert and oriented to person, place, and time.  Psychiatric:        Mood and Affect: Mood normal.        Behavior: Behavior normal.        Thought Content: Thought content normal.        Judgment: Judgment normal.    REACTIVE NST - FHR: 140 bpm / moderate variability / accels present / decels absent / TOCO: none  MAU Course  Procedures  MDM CCUA Wet Prep GC/CT -- Results pending   Results for orders placed or performed during the hospital encounter of 09/28/22 (from the past 24 hour(s))  Urinalysis, Routine w reflex microscopic -Urine, Clean Catch     Status: None   Collection Time: 09/28/22  9:50 AM  Result Value Ref Range   Color, Urine YELLOW YELLOW   APPearance CLEAR CLEAR   Specific Gravity, Urine 1.010 1.005 - 1.030   pH 8.0 5.0 - 8.0   Glucose, UA NEGATIVE NEGATIVE mg/dL   Hgb urine dipstick NEGATIVE NEGATIVE   Bilirubin Urine NEGATIVE NEGATIVE   Ketones, ur NEGATIVE NEGATIVE mg/dL   Protein, ur NEGATIVE NEGATIVE mg/dL   Nitrite NEGATIVE NEGATIVE   Leukocytes,Ua NEGATIVE NEGATIVE  Wet prep, genital     Status: Abnormal   Collection Time: 09/28/22  9:58 AM   Specimen: PATH Cytology Cervicovaginal Ancillary Only  Result Value Ref Range   Yeast Wet Prep HPF POC NONE SEEN NONE SEEN   Trich, Wet Prep NONE SEEN NONE SEEN   Clue Cells Wet Prep HPF POC NONE SEEN NONE SEEN   WBC, Wet Prep HPF POC >=10 (A) <10   Sperm NONE SEEN     Assessment and Plan  1. Spotting affecting pregnancy in third trimester -  Information provided on vaginal bleeding in pregnancy - Return to MAU: If you have heavier bleeding that soaks through more that 2 pads per hour for an hour or more If you bleed so much that you feel like you might pass  out or you do pass out If you have significant abdominal pain that is not improved with Tylenol 1000 mg every 8 hours as needed for pain If you develop a fever > 100.5    2. [redacted] weeks gestation of pregnancy   - Discharge patient - Keep scheduled appt with [facility] on 10/10/2022 - Patient verbalized an understanding of the plan of care and agrees.    Raelyn Mora, CNM 09/28/2022, 9:39 AM

## 2022-09-29 LAB — GC/CHLAMYDIA PROBE AMP (~~LOC~~) NOT AT ARMC
Chlamydia: NEGATIVE
Comment: NEGATIVE
Comment: NORMAL
Neisseria Gonorrhea: NEGATIVE

## 2022-10-09 ENCOUNTER — Encounter (HOSPITAL_COMMUNITY): Payer: Self-pay | Admitting: Obstetrics & Gynecology

## 2022-10-09 ENCOUNTER — Inpatient Hospital Stay (HOSPITAL_BASED_OUTPATIENT_CLINIC_OR_DEPARTMENT_OTHER): Payer: Medicaid Other

## 2022-10-09 ENCOUNTER — Inpatient Hospital Stay (HOSPITAL_COMMUNITY)
Admission: AD | Admit: 2022-10-09 | Discharge: 2022-10-09 | Disposition: A | Discharge status: Home / Self Care | Payer: Medicaid Other | Attending: Obstetrics & Gynecology | Admitting: Obstetrics & Gynecology

## 2022-10-09 ENCOUNTER — Other Ambulatory Visit: Payer: Self-pay

## 2022-10-09 DIAGNOSIS — Z3480 Encounter for supervision of other normal pregnancy, unspecified trimester: Secondary | ICD-10-CM

## 2022-10-09 DIAGNOSIS — Z3A33 33 weeks gestation of pregnancy: Secondary | ICD-10-CM

## 2022-10-09 DIAGNOSIS — O26893 Other specified pregnancy related conditions, third trimester: Secondary | ICD-10-CM

## 2022-10-09 DIAGNOSIS — N949 Unspecified condition associated with female genital organs and menstrual cycle: Secondary | ICD-10-CM | POA: Diagnosis not present

## 2022-10-09 DIAGNOSIS — R102 Pelvic and perineal pain: Secondary | ICD-10-CM | POA: Insufficient documentation

## 2022-10-09 LAB — WET PREP, GENITAL
Clue Cells Wet Prep HPF POC: NONE SEEN
Sperm: NONE SEEN
Trich, Wet Prep: NONE SEEN
WBC, Wet Prep HPF POC: <10 (ref <10)
Yeast Wet Prep HPF POC: NONE SEEN

## 2022-10-09 LAB — URINALYSIS, ROUTINE W REFLEX MICROSCOPIC
Bilirubin Urine: NEGATIVE
Glucose, UA: NEGATIVE mg/dL
Hgb urine dipstick: NEGATIVE
Ketones, ur: NEGATIVE mg/dL
Leukocytes,Ua: NEGATIVE
Nitrite: NEGATIVE
Protein, ur: NEGATIVE mg/dL
Specific Gravity, Urine: 1.006 (ref 1.005–1.030)
pH: 7 (ref 5.0–8.0)

## 2022-10-09 NOTE — MAU Provider Note (Signed)
History     CSN: [id number]  Arrival date and time: 10/09/22 0749     Chief Complaint  Patient presents with   Abdominal Pain   HPI This is a 22 year old G1, P0 at 33 weeks and 6 days who presents with intermittent, sharp stabbing left sided uterine pain.  When she has the pain, it lasts for 20 minutes or so, and then goes away for about 20-30 minutes.  Sometimes laying on her right side is helpful.  There is no provoking factors.  She is urinating and defecating normally.  She does not feel constipated.  Her appetite is normal.  No vaginal bleeding or abnormal discharge.  Good fetal movement.  No cramping or contractions.  OB History     Gravida  1   Para  0   Term  0   Preterm  0   AB  0   Living  0      SAB  0   IAB  0   Ectopic  0   Multiple  0   Live Births  0           Past Medical History:  Diagnosis Date   Anemia    Medical history non-contributory     Past Surgical History:  Procedure Laterality Date   NO PAST SURGERIES      Family History  Problem Relation Age of Onset   Heart Problems Maternal Grandmother    Diabetes Maternal Grandfather    Hypertension Paternal Grandfather    Kidney failure Paternal Grandfather    Asthma Neg Hx    Stroke Neg Hx     Social History   Tobacco Use   Smoking status: Never   Smokeless tobacco: Never  Vaping Use   Vaping Use: Never used  Substance Use Topics   Alcohol use: No    Comment: social   Drug use: Not Currently    Types: Marijuana    Allergies:  Allergies  Allergen Reactions   Prednisone Anaphylaxis    Other reaction(s): Cramps (ALLERGY/intolerance) Insomnia, severe Depression/moody    Medications Prior to Admission  Medication Sig Dispense Refill Last Dose   Ferric Maltol (ACCRUFER) 30 MG CAPS Take 1 capsule (30 mg total) by mouth 2 (two) times daily before a meal. Take 2 hrs before, or 2 hrs after a meal. 60 capsule 3 10/08/2022   loratadine (CLARITIN) 10 MG tablet Take 1  tablet (10 mg total) by mouth daily. 30 tablet 11 Past Week   pantoprazole (PROTONIX) 20 MG tablet Take 1 tablet (20 mg total) by mouth 2 (two) times daily before a meal. 60 tablet 5 10/08/2022   Prenatal Vit-Fe Fumarate-FA (PRENATAL VITAMIN PO) Take 1 tablet by mouth daily.   10/08/2022   terconazole (TERAZOL 7) 0.4 % vaginal cream Place 1 applicator vaginally at bedtime. (Patient not taking: Reported on 09/18/2022) 45 g 0    aspirin EC 81 MG tablet Take 1 tablet (81 mg total) by mouth daily. Swallow whole. 30 tablet 12    Blood Pressure Monitoring (BLOOD PRESSURE KIT) DEVI 1 Device by Does not apply route once a week. 1 each 0    cyclobenzaprine (FLEXERIL) 10 MG tablet Take 1 tablet (10 mg total) by mouth 2 (two) times daily as needed for muscle spasms. (Patient not taking: Reported on 08/02/2022) 20 tablet 0     Review of Systems Physical Exam   Blood pressure 121/63, pulse 98, temperature 98.4 F (36.9 C), temperature source Oral, resp. rate  18, height 5\' 1"  (1.549 m), weight 103.5 kg, last menstrual period 02/14/2022, SpO2 97 %.  Physical Exam Vitals and nursing note reviewed.  Constitutional:      Appearance: She is well-developed.  Cardiovascular:     Rate and Rhythm: Normal rate and regular rhythm.  Pulmonary:     Effort: Pulmonary effort is normal.     Breath sounds: Normal breath sounds.  Abdominal:     Tenderness: There is no guarding or rebound.     Comments: Abdomen soft, nontender.  The patient does have some mild tenderness to the left round ligament that radiates into the front part of the uterus, which is where she is feeling the pain.  Skin:    General: Skin is warm and dry.  Neurological:     Mental Status: She is alert.   Patient declined cervical check.  MAU Course  Procedures NST:  Baseline: 145  Variability: moderate Accelerations: present  Decelerations: none Contractions: occasional - patient unaware of contractions  MDM: moderate  This patient presents  to the ED for concern of   Chief Complaint  Patient presents with   Abdominal Pain     Co morbidities that complicate the patient evaluation:  Lab Tests: Wet prep and Gonorrhea/Chlamydia   Imaging Studies ordered:  I ordered imaging studies includingOther limited OB US Normal anterior placenta.  Medicines ordered and prescription drug management: Offered trial of muscle relaxant or tylenol - patient declined.    After the interventions noted above, I reevaluated the patient and found that they have :improved  Dispostion: discharged   Assessment and Plan   1. Supervision of other normal pregnancy, antepartum   2. [redacted] weeks gestation of pregnancy   3. Round ligament pain    Discharge to home. Recommended tylenol, occasional use of heating pad, abdominal support band. Return with contractions, bleeding.  Levie Heritage 10/09/2022, 8:47 AM

## 2022-10-09 NOTE — MAU Note (Signed)
Sarah Hopkins is a 22 y.o. at [redacted]w[redacted]d here in MAU reporting: she's having in left lower abdomen since last week.  Reports pain was initially intermittent and lasting 5-10 minutes before going away, but now the pain last longer (approx 20 minutes) is sharp & stabbing.  Reports hasn't taken any meds to treat discomfort.  Denies VB or LOF.  Endorses +FM.   LMP: NA Onset of complaint: last week Pain score: 0, prior to arrival was 8/10 Vitals:   10/09/22 0803  BP: 109/65  Pulse: 89  Resp: 18  Temp: 98.4 F (36.9 C)  SpO2: 97%     FHT: 138 bpm Lab orders placed from triage:   UA

## 2022-10-10 ENCOUNTER — Ambulatory Visit: Payer: Medicaid Other

## 2022-10-10 ENCOUNTER — Encounter: Payer: Medicaid Other | Admitting: Advanced Practice Midwife

## 2022-10-10 ENCOUNTER — Ambulatory Visit: Payer: Medicaid Other | Attending: Obstetrics

## 2022-10-10 LAB — GC/CHLAMYDIA PROBE AMP (~~LOC~~) NOT AT ARMC
Chlamydia: NEGATIVE
Comment: NEGATIVE
Comment: NORMAL
Neisseria Gonorrhea: NEGATIVE

## 2022-10-17 ENCOUNTER — Ambulatory Visit (HOSPITAL_BASED_OUTPATIENT_CLINIC_OR_DEPARTMENT_OTHER): Payer: Medicaid Other

## 2022-10-17 ENCOUNTER — Ambulatory Visit: Payer: Medicaid Other | Attending: Obstetrics | Admitting: *Deleted

## 2022-10-17 VITALS — BP 114/57 | HR 90

## 2022-10-17 DIAGNOSIS — O99213 Obesity complicating pregnancy, third trimester: Secondary | ICD-10-CM | POA: Insufficient documentation

## 2022-10-17 DIAGNOSIS — Z3480 Encounter for supervision of other normal pregnancy, unspecified trimester: Secondary | ICD-10-CM

## 2022-10-17 DIAGNOSIS — E669 Obesity, unspecified: Secondary | ICD-10-CM

## 2022-10-17 DIAGNOSIS — O365930 Maternal care for other known or suspected poor fetal growth, third trimester, not applicable or unspecified: Secondary | ICD-10-CM | POA: Insufficient documentation

## 2022-10-17 DIAGNOSIS — Z3A35 35 weeks gestation of pregnancy: Secondary | ICD-10-CM | POA: Insufficient documentation

## 2022-10-20 DIAGNOSIS — O99210 Obesity complicating pregnancy, unspecified trimester: Secondary | ICD-10-CM | POA: Insufficient documentation

## 2022-10-24 ENCOUNTER — Ambulatory Visit (INDEPENDENT_AMBULATORY_CARE_PROVIDER_SITE_OTHER): Payer: Medicaid Other | Admitting: Advanced Practice Midwife

## 2022-10-24 ENCOUNTER — Encounter: Payer: Self-pay | Admitting: Advanced Practice Midwife

## 2022-10-24 ENCOUNTER — Ambulatory Visit: Payer: Medicaid Other | Attending: Obstetrics

## 2022-10-24 ENCOUNTER — Ambulatory Visit: Payer: Medicaid Other

## 2022-10-24 VITALS — BP 121/75 | HR 93 | Wt 229.2 lb

## 2022-10-24 DIAGNOSIS — Z3A36 36 weeks gestation of pregnancy: Secondary | ICD-10-CM

## 2022-10-24 DIAGNOSIS — O99213 Obesity complicating pregnancy, third trimester: Secondary | ICD-10-CM

## 2022-10-24 DIAGNOSIS — Z3480 Encounter for supervision of other normal pregnancy, unspecified trimester: Secondary | ICD-10-CM

## 2022-10-24 NOTE — Progress Notes (Signed)
Pt presents for ROB. No concerns 

## 2022-10-24 NOTE — Addendum Note (Signed)
Addended by: Jearld Adjutant on: 10/24/2022 01:33 PM   Modules accepted: Orders

## 2022-10-24 NOTE — Progress Notes (Signed)
   PRENATAL VISIT NOTE  Subjective:  Sarah Hopkins is a 22 y.o. G1P0000 at [redacted]w[redacted]d being seen today for ongoing prenatal care.  She is currently monitored for the following issues for this low-risk pregnancy and has Supervision of other normal pregnancy, antepartum and Obesity affecting pregnancy on their problem list.  Patient reports occasional contractions.  Contractions: Irritability. Vag. Bleeding: None.  Movement: Present. Denies leaking of fluid.   The following portions of the patient's history were reviewed and updated as appropriate: allergies, current medications, past family history, past medical history, past social history, past surgical history and problem list.   Objective:   Vitals:   10/24/22 1120  BP: 121/75  Pulse: 93  Weight: 229 lb 3.2 oz (104 kg)    Fetal Status: Fetal Heart Rate (bpm): 138 Fundal Height: 38 cm Movement: Present     General:  Alert, oriented and cooperative. Patient is in no acute distress.  Skin: Skin is warm and dry. No rash noted.   Cardiovascular: Normal heart rate noted  Respiratory: Normal respiratory effort, no problems with respiration noted  Abdomen: Soft, gravid, appropriate for gestational age.  Pain/Pressure: Present     Pelvic: Cervical exam performed in the presence of a chaperone Dilation: Fingertip Effacement (%): 50 Station: -2  Extremities: Normal range of motion.  Edema: None  Mental Status: Normal mood and affect. Normal behavior. Normal judgment and thought content.   Assessment and Plan:  Pregnancy: G1P0000 at [redacted]w[redacted]d 1. Supervision of other normal pregnancy, antepartum --Anticipatory guidance about next visits/weeks of pregnancy given.  --Pt desires waterbirth. No contraindications at this time --Pt reports her recent growth US showed baby was 83% and that no further evaluation was recommended. --Review of [place] on 6/11 shows weekly antenatal testing is recommended. Current BMI over 40 but prepregnancy BMI 36.  Message  sent to MFM to clarify, as it looks like pt missed MFM appt this morning.   2. [redacted] weeks gestation of pregnancy    Term labor symptoms and general obstetric precautions including but not limited to vaginal bleeding, contractions, leaking of fluid and fetal movement were reviewed in detail with the patient. Please refer to After Visit Summary for other counseling recommendations.   Return in about 1 week (around 10/31/2022) for LOB, Midwife preferred.  Future Appointments  Date Time Provider Department Center  10/31/2022 12:30 PM [facility] NURSE [facility]  10/31/2022 12:45 PM [facility]  10/31/2022  2:30 PM Carlynn Herald, CNM [facility] None    Sharen Counter, CNM

## 2022-10-27 LAB — CULTURE, BETA STREP (GROUP B ONLY): Strep Gp B Culture: POSITIVE — AB

## 2022-10-28 ENCOUNTER — Encounter (HOSPITAL_BASED_OUTPATIENT_CLINIC_OR_DEPARTMENT_OTHER): Payer: Self-pay | Admitting: Advanced Practice Midwife

## 2022-10-28 DIAGNOSIS — O99820 Streptococcus B carrier state complicating pregnancy: Secondary | ICD-10-CM | POA: Insufficient documentation

## 2022-10-31 ENCOUNTER — Ambulatory Visit (HOSPITAL_BASED_OUTPATIENT_CLINIC_OR_DEPARTMENT_OTHER): Payer: Medicaid Other

## 2022-10-31 ENCOUNTER — Ambulatory Visit: Payer: Medicaid Other | Attending: Obstetrics | Admitting: *Deleted

## 2022-10-31 ENCOUNTER — Encounter: Payer: Self-pay | Admitting: Certified Nurse Midwife

## 2022-10-31 ENCOUNTER — Encounter: Payer: Self-pay | Admitting: *Deleted

## 2022-10-31 ENCOUNTER — Ambulatory Visit (INDEPENDENT_AMBULATORY_CARE_PROVIDER_SITE_OTHER): Payer: Medicaid Other | Admitting: Certified Nurse Midwife

## 2022-10-31 VITALS — BP 123/61 | HR 92

## 2022-10-31 VITALS — BP 122/82 | HR 95 | Wt 235.6 lb

## 2022-10-31 DIAGNOSIS — O403XX0 Polyhydramnios, third trimester, not applicable or unspecified: Secondary | ICD-10-CM | POA: Diagnosis not present

## 2022-10-31 DIAGNOSIS — Z3403 Encounter for supervision of normal first pregnancy, third trimester: Secondary | ICD-10-CM

## 2022-10-31 DIAGNOSIS — Z3A37 37 weeks gestation of pregnancy: Secondary | ICD-10-CM | POA: Insufficient documentation

## 2022-10-31 DIAGNOSIS — O99213 Obesity complicating pregnancy, third trimester: Secondary | ICD-10-CM | POA: Insufficient documentation

## 2022-10-31 DIAGNOSIS — B951 Streptococcus, group B, as the cause of diseases classified elsewhere: Secondary | ICD-10-CM

## 2022-10-31 DIAGNOSIS — Z3A36 36 weeks gestation of pregnancy: Secondary | ICD-10-CM

## 2022-10-31 DIAGNOSIS — N898 Other specified noninflammatory disorders of vagina: Secondary | ICD-10-CM

## 2022-10-31 DIAGNOSIS — E669 Obesity, unspecified: Secondary | ICD-10-CM

## 2022-10-31 DIAGNOSIS — Z3480 Encounter for supervision of other normal pregnancy, unspecified trimester: Secondary | ICD-10-CM

## 2022-10-31 NOTE — Progress Notes (Unsigned)
Pt presents for ROB visit. Pt c/o watery discharge. Feels she may be leaking fluid, requesting cervical check.

## 2022-11-02 NOTE — Progress Notes (Signed)
   PRENATAL VISIT NOTE  Subjective:  Sarah Hopkins is a 22 y.o. G1P0000 at [redacted]w[redacted]d being seen today for ongoing prenatal care.  She is currently monitored for the following issues for this low-risk pregnancy and has Supervision of other normal pregnancy, antepartum; Obesity affecting pregnancy; and GBS (group B Streptococcus carrier), +RV culture, currently pregnant on their problem list.  Patient reports  occasional watery vaginal discharge. She states its intermittent and denies wearing a pad. She also states that she had a [place] recently and noted "extra fluid."  .  Contractions: Irritability. Vag. Bleeding: None.  Movement: Present. Denies leaking of fluid.   The following portions of the patient's history were reviewed and updated as appropriate: allergies, current medications, past family history, past medical history, past social history, past surgical history and problem list.   Objective:   Vitals:   10/31/22 1403  BP: 122/82  Pulse: 95  Weight: 235 lb 9.6 oz (106.9 kg)    Fetal Status: Fetal Heart Rate (bpm): 145 Fundal Height: 41 cm Movement: Present     General:  Alert, oriented and cooperative. Patient is in no acute distress.  Skin: Skin is warm and dry. No rash noted.   Cardiovascular: Normal heart rate noted  Respiratory: Normal respiratory effort, no problems with respiration noted  Abdomen: Soft, gravid, appropriate for gestational age.  Pain/Pressure: Present     Pelvic: Cervical exam deferred        Extremities: Normal range of motion.  Edema: None  Mental Status: Normal mood and affect. Normal behavior. Normal judgment and thought content.   Assessment and Plan:  Pregnancy: G1P0000 at [redacted]w[redacted]d 1. Encounter for supervision of low-risk first pregnancy in third trimester - Patient doing well.  - Reports frequent and vigorous fetal movement.  - She has "Rhae Lerner" her FOB and "Kayla" her doula here for visit today and supportive.   2. Positive GBS test - Plan to Treat in  Labor with Antibiotics.   3. [redacted] weeks gestation of pregnancy - Fundal Height 41 cm today. Measured twice.  - Given recent dx of Poly, could be reason for FH >Date. Last EFW was 6lbs 7 oz on 10/17/22.  - Watch FH at next visit.   4. Vaginal discharge - Reviewed normal vaginal discharge as patient gets closer to labor.   Term labor symptoms and general obstetric precautions including but not limited to vaginal bleeding, contractions, leaking of fluid and fetal movement were reviewed in detail with the patient. Please refer to After Visit Summary for other counseling recommendations.   Return in about 1 week (around 11/07/2022) for LOB.  Future Appointments  Date Time Provider Department Center  11/06/2022  1:30 PM Constant, Gigi Gin, MD [facility] None  11/15/2022  1:30 PM Lennart Pall, MD [facility] None  11/22/2022  1:30 PM Sue Lush, FNP [facility] None    Konnar Ben Danella Deis) Suzie Portela, MSN, CNM  Center for [facility]  11/02/2022 3:33 AM

## 2022-11-06 ENCOUNTER — Encounter: Payer: Medicaid Other | Admitting: Student

## 2022-11-06 ENCOUNTER — Encounter: Payer: Medicaid Other | Admitting: Obstetrics and Gynecology

## 2022-11-07 ENCOUNTER — Telehealth (HOSPITAL_COMMUNITY): Payer: Self-pay | Admitting: *Deleted

## 2022-11-07 ENCOUNTER — Encounter (HOSPITAL_COMMUNITY): Payer: Self-pay

## 2022-11-07 ENCOUNTER — Ambulatory Visit (INDEPENDENT_AMBULATORY_CARE_PROVIDER_SITE_OTHER): Payer: Medicaid Other | Admitting: Obstetrics & Gynecology

## 2022-11-07 VITALS — BP 113/75 | HR 101 | Wt 236.0 lb

## 2022-11-07 DIAGNOSIS — O403XX0 Polyhydramnios, third trimester, not applicable or unspecified: Secondary | ICD-10-CM

## 2022-11-07 DIAGNOSIS — O99820 Streptococcus B carrier state complicating pregnancy: Secondary | ICD-10-CM

## 2022-11-07 DIAGNOSIS — Z3480 Encounter for supervision of other normal pregnancy, unspecified trimester: Secondary | ICD-10-CM

## 2022-11-07 NOTE — Telephone Encounter (Signed)
Preadmission screen  

## 2022-11-07 NOTE — Progress Notes (Signed)
   PRENATAL VISIT NOTE  Subjective:  Sarah Hopkins is a 22 y.o. G1P0000 at [redacted]w[redacted]d being seen today for ongoing prenatal care.  She is currently monitored for the following issues for this high-risk pregnancy and has Supervision of other normal pregnancy, antepartum; Obesity affecting pregnancy; and GBS (group B Streptococcus carrier), +RV culture, currently pregnant on their problem list.  Patient reports occasional contractions.  Contractions: Irregular. Vag. Bleeding: None.  Movement: Present. Denies leaking of fluid.   The following portions of the patient's history were reviewed and updated as appropriate: allergies, current medications, past family history, past medical history, past social history, past surgical history and problem list.   Objective:   Vitals:   11/07/22 1056  BP: 113/75  Pulse: (!) 101  Weight: 236 lb (107 kg)    Fetal Status: Fetal Heart Rate (bpm): 140   Movement: Present  Presentation: Vertex  General:  Alert, oriented and cooperative. Patient is in no acute distress.  Skin: Skin is warm and dry. No rash noted.   Cardiovascular: Normal heart rate noted  Respiratory: Normal respiratory effort, no problems with respiration noted  Abdomen: Soft, gravid, appropriate for gestational age.  Pain/Pressure: Present     Pelvic: Cervical exam performed in the presence of a chaperone Dilation: 1 Effacement (%): 20 Station: Ballotable  Extremities: Normal range of motion.     Mental Status: Normal mood and affect. Normal behavior. Normal judgment and thought content.   Assessment and Plan:  Pregnancy: G1P0000 at [redacted]w[redacted]d 1. Supervision of other normal pregnancy, antepartum   2. GBS (group B Streptococcus carrier), +RV culture, currently pregnant   3. Polyhydramnios affecting pregnancy in third trimester IOL 39 weeks  Term labor symptoms and general obstetric precautions including but not limited to vaginal bleeding, contractions, leaking of fluid and fetal  movement were reviewed in detail with the patientIOL  Please refer to After Visit Summary for other counseling recommendations.   Return if symptoms worsen or fail to improve, for postpartum.  Future Appointments  Date Time Provider Department Center  11/15/2022  1:30 PM Lennart Pall, MD [facility] None  11/22/2022  1:30 PM Sue Lush, FNP [facility] None    Scheryl Darter, MD

## 2022-11-07 NOTE — Progress Notes (Signed)
Pt would like cervix check. 

## 2022-11-08 ENCOUNTER — Telehealth (HOSPITAL_COMMUNITY): Payer: Self-pay | Admitting: *Deleted

## 2022-11-08 NOTE — Telephone Encounter (Signed)
Preadmission screen  

## 2022-11-10 ENCOUNTER — Telehealth: Payer: Self-pay

## 2022-11-10 NOTE — Telephone Encounter (Signed)
Patient called in, answered questions about membrane sweeping before induction, IOL scheduled for 11/14/22.

## 2022-11-13 ENCOUNTER — Telehealth (HOSPITAL_COMMUNITY): Payer: Self-pay | Admitting: *Deleted

## 2022-11-13 ENCOUNTER — Encounter (HOSPITAL_COMMUNITY): Payer: Self-pay | Admitting: *Deleted

## 2022-11-13 NOTE — Telephone Encounter (Signed)
Preadmission screen  

## 2022-11-13 NOTE — Telephone Encounter (Signed)
Preadmission screen message left on significany other's voicemail

## 2022-11-14 ENCOUNTER — Inpatient Hospital Stay (HOSPITAL_COMMUNITY): Payer: Medicaid Other

## 2022-11-15 ENCOUNTER — Encounter: Payer: Medicaid Other | Admitting: Obstetrics and Gynecology

## 2022-11-15 ENCOUNTER — Inpatient Hospital Stay (HOSPITAL_COMMUNITY): Admission: AD | Admit: 2022-11-15 | Payer: Medicaid Other | Source: Home / Self Care | Admitting: Family Medicine

## 2022-11-15 ENCOUNTER — Encounter (HOSPITAL_COMMUNITY): Payer: Self-pay | Admitting: Family Medicine

## 2022-11-15 ENCOUNTER — Other Ambulatory Visit: Payer: Self-pay

## 2022-11-15 ENCOUNTER — Inpatient Hospital Stay (HOSPITAL_COMMUNITY)
Admission: RE | Admit: 2022-11-15 | Discharge: 2022-11-19 | DRG: 786 | Disposition: A | Discharge status: Home / Self Care | Payer: Medicaid Other | Attending: Family Medicine | Admitting: Family Medicine

## 2022-11-15 ENCOUNTER — Encounter: Payer: Medicaid Other | Admitting: Student

## 2022-11-15 DIAGNOSIS — N179 Acute kidney failure, unspecified: Secondary | ICD-10-CM | POA: Diagnosis not present

## 2022-11-15 DIAGNOSIS — O99820 Streptococcus B carrier state complicating pregnancy: Secondary | ICD-10-CM

## 2022-11-15 DIAGNOSIS — O99824 Streptococcus B carrier state complicating childbirth: Secondary | ICD-10-CM | POA: Diagnosis present

## 2022-11-15 DIAGNOSIS — Z3A39 39 weeks gestation of pregnancy: Secondary | ICD-10-CM | POA: Diagnosis not present

## 2022-11-15 DIAGNOSIS — O403XX0 Polyhydramnios, third trimester, not applicable or unspecified: Principal | ICD-10-CM | POA: Diagnosis present

## 2022-11-15 DIAGNOSIS — O409XX0 Polyhydramnios, unspecified trimester, not applicable or unspecified: Secondary | ICD-10-CM | POA: Diagnosis present

## 2022-11-15 DIAGNOSIS — O322XX0 Maternal care for transverse and oblique lie, not applicable or unspecified: Secondary | ICD-10-CM | POA: Diagnosis present

## 2022-11-15 DIAGNOSIS — O9049 Other postpartum acute kidney failure: Secondary | ICD-10-CM | POA: Diagnosis not present

## 2022-11-15 DIAGNOSIS — O9081 Anemia of the puerperium: Secondary | ICD-10-CM | POA: Diagnosis not present

## 2022-11-15 DIAGNOSIS — D62 Acute posthemorrhagic anemia: Secondary | ICD-10-CM | POA: Diagnosis not present

## 2022-11-15 DIAGNOSIS — Z3490 Encounter for supervision of normal pregnancy, unspecified, unspecified trimester: Principal | ICD-10-CM | POA: Diagnosis present

## 2022-11-15 DIAGNOSIS — O99214 Obesity complicating childbirth: Secondary | ICD-10-CM | POA: Diagnosis present

## 2022-11-15 DIAGNOSIS — O99210 Obesity complicating pregnancy, unspecified trimester: Secondary | ICD-10-CM | POA: Diagnosis present

## 2022-11-15 LAB — CBC
HCT: 33.8 % — ABNORMAL LOW (ref 36.0–46.0)
Hemoglobin: 11 g/dL — ABNORMAL LOW (ref 12.0–15.0)
MCH: 27.1 pg (ref 26.0–34.0)
MCHC: 32.5 g/dL (ref 30.0–36.0)
MCV: 83.3 fL (ref 80.0–100.0)
Platelets: 264 10*3/uL (ref 150–400)
RBC: 4.06 MIL/uL (ref 3.87–5.11)
RDW: 13.8 % (ref 11.5–15.5)
WBC: 6.7 10*3/uL (ref 4.0–10.5)
nRBC: 0 % (ref 0.0–0.2)

## 2022-11-15 LAB — TYPE AND SCREEN
ABO/RH(D): A POS
Antibody Screen: NEGATIVE

## 2022-11-15 LAB — RPR: RPR Ser Ql: NONREACTIVE

## 2022-11-15 MED ORDER — TERBUTALINE SULFATE 1 MG/ML IJ SOLN: 0.2500 mg | Freq: Once | INTRAMUSCULAR | Status: DC | PRN

## 2022-11-15 MED ORDER — OXYCODONE-ACETAMINOPHEN 5-325 MG PO TABS: 2.0000 | ORAL_TABLET | ORAL | Status: DC | PRN

## 2022-11-15 MED ORDER — LACTATED RINGERS IV SOLN: INTRAVENOUS | Status: DC

## 2022-11-15 MED ORDER — LACTATED RINGERS IV SOLN: 500.0000 mL | INTRAVENOUS | Status: DC | PRN

## 2022-11-15 MED ORDER — SODIUM CHLORIDE 0.9 % IV SOLN
5.0000 10*6.[IU] | Freq: Once | INTRAVENOUS | Status: AC
  Administered 2022-11-15: 5 10*6.[IU] via INTRAVENOUS
  Filled 2022-11-15: qty 5

## 2022-11-15 MED ORDER — ONDANSETRON HCL 4 MG/2ML IJ SOLN
4.0000 mg | Freq: Four times a day (QID) | INTRAMUSCULAR | Status: DC | PRN
  Administered 2022-11-15: 4 mg via INTRAVENOUS
  Filled 2022-11-15: qty 2

## 2022-11-15 MED ORDER — OXYTOCIN BOLUS FROM INFUSION: 333.0000 mL | Freq: Once | INTRAVENOUS | Status: DC

## 2022-11-15 MED ORDER — MISOPROSTOL 50MCG HALF TABLET
50.0000 ug | ORAL_TABLET | Freq: Once | ORAL | Status: AC
  Administered 2022-11-15: 50 ug via ORAL
  Filled 2022-11-15: qty 1

## 2022-11-15 MED ORDER — OXYCODONE-ACETAMINOPHEN 5-325 MG PO TABS: 1.0000 | ORAL_TABLET | ORAL | Status: DC | PRN

## 2022-11-15 MED ORDER — FENTANYL CITRATE (PF) 100 MCG/2ML IJ SOLN
50.0000 ug | INTRAMUSCULAR | Status: DC | PRN
  Administered 2022-11-15 (×2): 100 ug via INTRAVENOUS
  Administered 2022-11-15: 50 ug via INTRAVENOUS
  Administered 2022-11-15: 100 ug via INTRAVENOUS
  Filled 2022-11-15 (×4): qty 2

## 2022-11-15 MED ORDER — LIDOCAINE HCL (PF) 1 % IJ SOLN: 30.0000 mL | INTRAMUSCULAR | Status: DC | PRN

## 2022-11-15 MED ORDER — PENICILLIN G POT IN DEXTROSE 60000 UNIT/ML IV SOLN
2.5000 10*6.[IU] | INTRAVENOUS | Status: DC
  Administered 2022-11-15 – 2022-11-16 (×3): 2.5 10*6.[IU] via INTRAVENOUS
  Filled 2022-11-15 (×3): qty 50

## 2022-11-15 MED ORDER — SOD CITRATE-CITRIC ACID 500-334 MG/5ML PO SOLN
30.0000 mL | ORAL | Status: DC | PRN
  Administered 2022-11-16: 30 mL via ORAL
  Filled 2022-11-15 (×2): qty 30

## 2022-11-15 MED ORDER — OXYTOCIN-SODIUM CHLORIDE 30-0.9 UT/500ML-% IV SOLN
1.0000 m[IU]/min | INTRAVENOUS | Status: DC
  Administered 2022-11-15: 2 m[IU]/min via INTRAVENOUS

## 2022-11-15 MED ORDER — ACETAMINOPHEN 325 MG PO TABS: 650.0000 mg | ORAL_TABLET | ORAL | Status: DC | PRN

## 2022-11-15 MED ORDER — OXYTOCIN-SODIUM CHLORIDE 30-0.9 UT/500ML-% IV SOLN
2.5000 [IU]/h | INTRAVENOUS | Status: DC
  Filled 2022-11-15: qty 500

## 2022-11-15 MED ORDER — CALCIUM CARBONATE ANTACID 500 MG PO CHEW
400.0000 mg | CHEWABLE_TABLET | ORAL | Status: DC | PRN
  Administered 2022-11-15: 400 mg via ORAL
  Filled 2022-11-15: qty 2

## 2022-11-15 MED ORDER — MISOPROSTOL 25 MCG QUARTER TABLET
25.0000 ug | ORAL_TABLET | Freq: Once | ORAL | Status: AC
  Administered 2022-11-15: 25 ug via VAGINAL
  Filled 2022-11-15: qty 1

## 2022-11-15 NOTE — Progress Notes (Signed)
Sarah Hopkins is a 22 y.o. G1P0000 at [redacted]w[redacted]d by LMP admitted for Polyhydramnios.   Subjective: Patient doing well. Family present at bedside and supportive. FB expelled. Patient just showered and feeling refreshed.   Objective: BP 119/74   Pulse 98   Temp 98.1 F (36.7 C) (Oral)   Resp 18   Ht 5\' 1"  (1.549 m)   Wt 108 kg   LMP 02/14/2022 (Exact Date)   BMI 44.99 kg/m  No intake/output data recorded. No intake/output data recorded.  FHT:  FHR: 140 bpm, variability: moderate,  accelerations:  Present,  decelerations:  Absent UC:   irregular, every 7-10 minutes SVE:   Dilation: 6 Effacement (%): 70 Station: -2 Exam by:: Shay, CNM  Labs: Lab Results  Component Value Date   WBC 6.7 11/15/2022   HGB 11.0 (L) 11/15/2022   HCT 33.8 (L) 11/15/2022   MCV 83.3 11/15/2022   PLT 264 11/15/2022   Patient Vitals for the past 24 hrs:  BP Temp Temp src Pulse Resp Height Weight  11/15/22 1750 95/78 -- -- (!) 257 -- -- --  11/15/22 1701 119/74 -- -- 98 18 -- --  11/15/22 1553 121/71 -- -- 91 -- -- --  11/15/22 1500 116/60 -- -- 83 17 -- --  11/15/22 1359 120/65 -- -- 81 18 -- --  11/15/22 1351 -- 98.1 F (36.7 C) Oral -- -- -- --  11/15/22 1316 118/70 -- -- 83 -- -- --  11/15/22 1123 119/74 -- -- 85 -- -- --  11/15/22 1122 -- -- -- -- 17 -- --  11/15/22 1031 -- 97.6 F (36.4 C) Oral -- 16 -- --  11/15/22 0956 120/78 -- -- 84 17 -- --  11/15/22 0900 120/68 -- -- 84 17 -- --  11/15/22 0830 -- 99.4 F (37.4 C) Oral -- -- -- --  11/15/22 0745 126/70 -- -- 94 17 -- --  11/15/22 0606 120/76 99.3 F (37.4 C) Oral 95 16 -- --  11/15/22 0521 (!) 105/59 -- -- 92 -- -- --  11/15/22 0318 125/69 -- -- 92 16 -- --  11/15/22 0213 118/78 98 F (36.7 C) Oral 86 16 -- --  11/15/22 0117 -- -- -- -- -- 5\' 1"  (1.549 m) 108 kg  11/15/22 0055 122/84 -- -- 94 -- -- --   CNM to patient bedside to discuss AROM versus pitocin. Risks and benefits of both discussed. Reviewed natural hormones  associated with AROM versus pit and the option to turn pit off if need be for patient to get in tub. Patient does not want Pit at this time. Through shared decision making between CNM  and patient,  desires to AROM and CNM agreeable to plan of care. FHT Cat I prior to AROM. SVE 5.5/70/-3 prior to AROM. AROM successful with [place] on first attempt without difficulty. Copious amounts of clear fluid noted. Fetal head well applied to cervix following AROM. FHT remained cat I following AROM.       Assessment / Plan: Induction of labor due to polyhydramnios s/p Cytotec and AROM.   Labor:  AROM clear fluid. May start pit if contraction cease following AROM.  Fetal Wellbeing:  Category I- continuously monitoring  Pain Control:  Labor support without medications at this time  I/D:   GBS Positive , PCN Anticipated MOD:  NSVD  Claudette Head, CNM 11/15/2022, 5:49 PM

## 2022-11-15 NOTE — Progress Notes (Signed)
Sarah Hopkins is a 22 y.o. G1P0000 at [redacted]w[redacted]d by LMP admitted for induction of labor due to Polyhydramnios .  Subjective: Patient doing well. FOB and sister present and supportive.   Objective: BP 118/70   Pulse 83   Temp 97.6 F (36.4 C) (Oral)   Resp 17   Ht 5\' 1"  (1.549 m)   Wt 108 kg   LMP 02/14/2022 (Exact Date)   BMI 44.99 kg/m  No intake/output data recorded. No intake/output data recorded.  FHT:  FHR: 135 bpm, variability: moderate,  accelerations:  Present,  decelerations:  Absent UC:   irregular, every 7 minutes SVE:   Dilation: 1.5 Effacement (%): 70 Station: Ballotable Exam by:: Psychologist, educational, CNM  Labs: Lab Results  Component Value Date   WBC 6.7 11/15/2022   HGB 11.0 (L) 11/15/2022   HCT 33.8 (L) 11/15/2022   MCV 83.3 11/15/2022   PLT 264 11/15/2022   CNM to patient bedside to discuss Foley Balloon insertion. Risks and benefits of insertion reviewed with patient and family. All questions answered at bedside. Patient desires FB placement for cervical ripening. SVE 1.5/70/ballotable S. Suzie Portela CNM.  FHT cat I prior to placement. of Fentanyl given to patient prior to insertion for pain management during placement. Foley Balloon placed without difficulty. Filled with 60cc of Water. Traction applied. FHT cat I following placement and patient tolerated insertion well.   Assessment / Plan: Induction of labor due to polyhydramnios ,s/p Cytotec.   Labor:  Insertion of FB. Assess for AROM and Pit following expulsion.   Preeclampsia:   N/A  Fetal Wellbeing:  Category I Pain Control:  IV pain meds, patient still desires to labor unmediated  I/D:   GBS positive, start antibiotics in active labor.  Anticipated MOD:  NSVD  Claudette Head, CNM 11/15/2022, 1:35 PM

## 2022-11-15 NOTE — Progress Notes (Signed)
Sarah Hopkins is a 22 y.o. G1P0000 at [redacted]w[redacted]d by LMP admitted for induction of labor due to Polyhydramnios. Last [place] on 10/31/22 Amniotic fluid volume: Polyhydramnios. AFI: 25.86 cm.  MVP:  8.34 cm.  Subjective: Patient doing well. Introductions exchanged. FOB "Rhae Lerner" present for Visit and supportive. Up and moving in the halls. Patient desires light laboring diet.   Objective: BP 126/70   Pulse 94   Temp 99.3 F (37.4 C) (Oral)   Resp 17   Ht 5\' 1"  (1.549 m)   Wt 108 kg   LMP 02/14/2022 (Exact Date)   BMI 44.99 kg/m  No intake/output data recorded. No intake/output data recorded.  FHT:  FHR: 135 bpm, variability: moderate,  accelerations:  Present,  decelerations:  Absent UC:   regular, every 3-5 minutes SVE:   Dilation: Fingertip Effacement (%): Thick Station: Ballotable Exam by:: Darleene Cleaver, RN  Labs: Lab Results  Component Value Date   WBC 6.7 11/15/2022   HGB 11.0 (L) 11/15/2022   HCT 33.8 (L) 11/15/2022   MCV 83.3 11/15/2022   PLT 264 11/15/2022   Patient Vitals for the past 24 hrs:  BP Temp Temp src Pulse Resp Height Weight  11/15/22 0900 120/68 -- -- 84 17 -- --  11/15/22 0830 -- 99.4 F (37.4 C) Oral -- -- -- --  11/15/22 0745 126/70 -- -- 94 17 -- --  11/15/22 0606 120/76 99.3 F (37.4 C) Oral 95 16 -- --  11/15/22 0521 (!) 105/59 -- -- 92 -- -- --  11/15/22 0318 125/69 -- -- 92 16 -- --  11/15/22 0213 118/78 98 F (36.7 C) Oral 86 16 -- --  11/15/22 0117 -- -- -- -- -- 5\' 1"  (1.549 m) 108 kg  11/15/22 0055 122/84 -- -- 94 -- -- --    Assessment / Plan: Erick Blinks , a  22 y.o. G1P0000 at [redacted]w[redacted]d presents to L&D for Induction of labor due to polyhydramnios s/p Dual cytotec 50/25 and repeat dose of 25 vaginal of Cytotec.   Labor:  At 4 hour reassess cervix and attempt FB placement if able .  Preeclampsia:   N/A  Fetal Wellbeing:  Category I   Pain Control:  Labor support without medications. Patient desires WB. CNM to reviewed policy to confirm no  contraindications with poly.  I/D:   GBS positive. Start PCN prior to rupture or in active labor.  Anticipated MOD:  NSVD  Claudette Head, CNM 11/15/2022, 8:59 AM

## 2022-11-15 NOTE — H&P (Signed)
OBSTETRIC ADMISSION HISTORY AND PHYSICAL  Sarah Hopkins is a 22 y.o. female G1P0000 with IUP at [redacted]w[redacted]d by LMP presenting for scheduled IOL due to polyhydramnios. She reports +FMs, No LOF, no VB, no blurry vision, headaches or peripheral edema, and RUQ pain.  She plans on bottle feeding. She requests no birth control. She received her prenatal care at [facility]  Dating: By LMP --->  Estimated Date of Delivery: 11/21/22  Sono:    @[redacted]w[redacted]d , CWD, normal anatomy, cephalic presentation, 2910g, 16% EFW  Prenatal History/Complications:  Previous SGA - resolved Polyhydramnios with AFI 25.6        Nursing Staff Provider  Office Location [facility] Dating  11/21/2022, by Last Menstrual Period  [facility] Model Arly.Keller ] Traditional [ ]  Centering [ ]  Mom-Baby Dyad Anatomy US   06/27/22, incomplete, f/u scan scheduled  Language  English      Flu Vaccine   No 2023 Genetic/Carrier Screen  NIPS:    AFP:    Horizon:  TDaP Vaccine  08/30/2022 Hgb A1C or  GTT Early  Third trimester   COVID Vaccine  Yes   LAB RESULTS   Rhogam  A/Positive/-- (01/04 1409)  Blood Type A/Positive/-- (01/04 1409)   Baby Feeding Plan  bottle Antibody Negative (01/04 1409)  Contraception  None Rubella 2.28 (01/04 1409)  Circumcision  Yes if female RPR Non Reactive (01/04 1409)   Pediatrician   [facility] HBsAg Negative (01/04 1409)   Support Person  Caleb HCVAb Non Reactive (01/04 1409)   Prenatal Classes   HIV Non Reactive (01/04 1409)     BTL Consent   GBS (For PCN allergy, check sensitivities)   VBAC Consent   Pap No results found for: "DIAGPAP"           DME Rx [X]  BP cuff [ ]  Weight Scale Waterbirth  [ ]  Class [ ]  Consent [ ]  CNM visit  PHQ9 & GAD7 [X]  new OB [ X ] 28 weeks  [  ] 36 weeks Induction  [ ]  Orders Entered [ ] Foley Y/N     Past Medical History: Past Medical History:  Diagnosis Date   Anemia    Medical history non-contributory     Past Surgical History: Past Surgical History:  Procedure  Laterality Date   NO PAST SURGERIES      Obstetrical History: OB History     Gravida  1   Para  0   Term  0   Preterm  0   AB  0   Living  0      SAB  0   IAB  0   Ectopic  0   Multiple  0   Live Births  0           Social History Social History   Socioeconomic History   Marital status: Single    Spouse name: Not on file   Number of children: Not on file   Years of education: Not on file   Highest education level: Not on file  Occupational History   Not on file  Tobacco Use   Smoking status: Never   Smokeless tobacco: Never  Vaping Use   Vaping Use: Never used  Substance and Sexual Activity   Alcohol use: No    Comment: social   Drug use: Not Currently    Types: Marijuana    Comment: last use dec 2023   Sexual activity: Yes    Partners: Male  Birth control/protection: None  Other Topics Concern   Not on file  Social History Narrative   Not on file   Social Determinants of Health   Financial Resource Strain: Not on file  Food Insecurity: No Food Insecurity (11/15/2022)   Hunger Vital Sign    Worried About Running Out of Food in the Last Year: Never true    Ran Out of Food in the Last Year: Never true  Transportation Needs: No Transportation Needs (11/15/2022)   PRAPARE - Administrator, Civil Service (Medical): No    Lack of Transportation (Non-Medical): No  Physical Activity: Not on file  Stress: Not on file  Social Connections: Not on file    Family History: Family History  Problem Relation Age of Onset   Heart Problems Maternal Grandmother    Diabetes Maternal Grandfather    Hypertension Paternal Grandfather    Kidney failure Paternal Grandfather    Asthma Neg Hx    Stroke Neg Hx     Allergies: Allergies  Allergen Reactions   Prednisone Anaphylaxis    Other reaction(s): Cramps (ALLERGY/intolerance) Insomnia, severe Depression/moody    Medications Prior to Admission  Medication Sig Dispense Refill Last  Dose   Blood Pressure Monitoring (BLOOD PRESSURE KIT) DEVI 1 Device by Does not apply route once a week. 1 each 0    Ferric Maltol (ACCRUFER) 30 MG CAPS Take 1 capsule (30 mg total) by mouth 2 (two) times daily before a meal. Take 2 hrs before, or 2 hrs after a meal. 60 capsule 3    loratadine (CLARITIN) 10 MG tablet Take 1 tablet (10 mg total) by mouth daily. 30 tablet 11    pantoprazole (PROTONIX) 20 MG tablet Take 1 tablet (20 mg total) by mouth 2 (two) times daily before a meal. 60 tablet 5    Prenatal Vit-Fe Fumarate-FA (PRENATAL VITAMIN PO) Take 1 tablet by mouth daily.        Review of Systems   All systems reviewed and negative except as stated in HPI  Blood pressure 118/78, pulse 86, temperature 98 F (36.7 C), temperature source Oral, resp. rate 16, height 5\' 1"  (1.549 m), weight 108 kg, last menstrual period 02/14/2022.  General appearance: alert, cooperative, and appears stated age Lungs: clear to auscultation bilaterally Heart: regular rate and rhythm Abdomen: soft, non-tender; bowel sounds normal Pelvic: see below Extremities: Homans sign is negative, no sign of DVT  Presentation: cephalic  Fetal monitoring: 130bpm, moderate variability, +accels , no decels Uterine activity: no significant uterine contractions.    Prenatal labs: ABO, Rh: --/--/A POS (07/10 0109) Antibody: NEG (07/10 0109) Rubella: 2.28 (01/04 1409) RPR: Non Reactive (04/24 0833)  HBsAg: Negative (01/04 1409)  HIV: Non Reactive (04/24 4098)  GBS: Positive/-- (06/18 1337)  2 hr Glucola normal Genetic screening  low risk Anatomy US - SGA initially, resolved at re-eval.  Prenatal Transfer Tool  Maternal Diabetes: No Genetic Screening: Normal Maternal Ultrasounds/Referrals: Normal Fetal Ultrasounds or other Referrals:  None Maternal Substance Abuse:  No Significant Maternal Medications:  None Significant Maternal Lab Results:  Group B Strep positive Number of Prenatal Visits:greater than 3  verified prenatal visits Other Comments:  None  Results for orders placed or performed during the hospital encounter of 11/15/22 (from the past 24 hour(s))  CBC   Collection Time: 11/15/22  1:08 AM  Result Value Ref Range   WBC 6.7 4.0 - 10.5 K/uL   RBC 4.06 3.87 - 5.11 MIL/uL   Hemoglobin 11.0 (  L) 12.0 - 15.0 g/dL   HCT 16.1 (L) 09.6 - 04.5 %   MCV 83.3 80.0 - 100.0 fL   MCH 27.1 26.0 - 34.0 pg   MCHC 32.5 30.0 - 36.0 g/dL   RDW 40.9 81.1 - 91.4 %   Platelets 264 150 - 400 K/uL   nRBC 0.0 0.0 - 0.2 %  Type and screen [facility]   Collection Time: 11/15/22  1:09 AM  Result Value Ref Range   ABO/RH(D) A POS    Antibody Screen NEG    Sample Expiration      11/18/2022,2359 Performed at [facility], [address], [place], [place] [address]     Patient Active Problem List   Diagnosis Date Noted   Encounter for planned induction of labor 11/15/2022   GBS (group B Streptococcus carrier), +RV culture, currently pregnant 10/28/2022   Obesity affecting pregnancy 10/20/2022   Supervision of other normal pregnancy, antepartum 04/19/2022    Assessment/Plan:  Sarah Hopkins is a 22 y.o. G1P0000 at [redacted]w[redacted]d here for scheduled IOL due to polyhydramnios  #Labor: admit to Labor and delivery. Start IOL with cytotec 50 PO and 25 vaginal Plan to recheck in 4 hrs, sooner if indicated.; can consider a FB when next cytotec is due. #Pain: Family support, IV fentanyl, epidural when ready  #FWB: Cat 1 #ID: GBS +, on PCN for labor #MOF: bottle  #MOC:none #Circ:  Yes.  Sheppard Evens MD MPH OB Fellow, Faculty Practice [facility], Center for [facility] 11/15/2022

## 2022-11-15 NOTE — Progress Notes (Signed)
Patient ID: Sarah Hopkins, female   DOB: 18-Apr-2001, 22 y.o.   MRN: [id number]  Just starting to use nitrous; received Fentanyl a little earlier; PCN x 1 given; requesting to get into tub  BPs 89/62, 95/78 FHR 130s, +accels, no decels, Cat 1 (no concerns w FHR) Ctx q 3 mins  Cx deferred  IUP@39 .1wks Polyhydramnios GBS+ Early active labor  Prepare tub; use nitrous in the meantime; 2nd dose of PCN @ 2100; anticipate vag delivery  [name] [facility] 11/15/2022 7:49 PM

## 2022-11-15 NOTE — Progress Notes (Signed)
Found patient off unit with external monitors and IV pole. Patient educated to not leave unit. Educated patient on the risks of leaving the unit. Pt verbalized understanding.

## 2022-11-16 ENCOUNTER — Inpatient Hospital Stay (HOSPITAL_COMMUNITY): Payer: Medicaid Other | Admitting: Anesthesiology

## 2022-11-16 ENCOUNTER — Encounter (HOSPITAL_COMMUNITY): Payer: Self-pay | Admitting: Family Medicine

## 2022-11-16 ENCOUNTER — Encounter (HOSPITAL_COMMUNITY)
Admission: RE | Disposition: A | Discharge status: Home / Self Care | Payer: Self-pay | Source: Home / Self Care | Attending: Family Medicine

## 2022-11-16 DIAGNOSIS — Z3A39 39 weeks gestation of pregnancy: Secondary | ICD-10-CM

## 2022-11-16 SURGERY — Surgical Case
Anesthesia: Epidural | Site: Abdomen

## 2022-11-16 MED ORDER — LIDOCAINE HCL (PF) 1 % IJ SOLN
INTRAMUSCULAR | Status: DC | PRN
  Administered 2022-11-16 (×2): 4 mL via EPIDURAL

## 2022-11-16 MED ORDER — CEFAZOLIN SODIUM-DEXTROSE 2-4 GM/100ML-% IV SOLN
2.0000 g | INTRAVENOUS | Status: AC
  Administered 2022-11-16: 2 g via INTRAVENOUS

## 2022-11-16 MED ORDER — EPHEDRINE 5 MG/ML INJ: 10.0000 mg | INTRAVENOUS | Status: DC | PRN

## 2022-11-16 MED ORDER — PHENYLEPHRINE 80 MCG/ML (10ML) SYRINGE FOR IV PUSH (FOR BLOOD PRESSURE SUPPORT): 80.0000 ug | PREFILLED_SYRINGE | INTRAVENOUS | Status: DC | PRN

## 2022-11-16 MED ORDER — PENICILLIN G POT IN DEXTROSE 60000 UNIT/ML IV SOLN
2.5000 10*6.[IU] | INTRAVENOUS | Status: DC
  Administered 2022-11-16: 3 10*6.[IU] via INTRAVENOUS
  Filled 2022-11-16: qty 50

## 2022-11-16 MED ORDER — BUTORPHANOL TARTRATE 2 MG/ML IJ SOLN
2.0000 mg | Freq: Once | INTRAMUSCULAR | Status: DC
  Filled 2022-11-16: qty 1

## 2022-11-16 MED ORDER — LACTATED RINGERS IV SOLN
500.0000 mL | Freq: Once | INTRAVENOUS | Status: AC
  Administered 2022-11-16: 500 mL via INTRAVENOUS

## 2022-11-16 MED ORDER — PENICILLIN G POT IN DEXTROSE 60000 UNIT/ML IV SOLN: 3.0000 10*6.[IU] | INTRAVENOUS | Status: DC

## 2022-11-16 MED ORDER — MORPHINE SULFATE (PF) 0.5 MG/ML IJ SOLN
INTRAMUSCULAR | Status: AC
  Filled 2022-11-16: qty 10

## 2022-11-16 MED ORDER — SODIUM CHLORIDE 0.9 % IV SOLN
12.5000 mg | Freq: Once | INTRAVENOUS | Status: DC
  Filled 2022-11-16 (×2): qty 0.5

## 2022-11-16 MED ORDER — FENTANYL CITRATE (PF) 100 MCG/2ML IJ SOLN
INTRAMUSCULAR | Status: AC
  Filled 2022-11-16: qty 2

## 2022-11-16 MED ORDER — FENTANYL-BUPIVACAINE-NACL 0.5-0.125-0.9 MG/250ML-% EP SOLN
12.0000 mL/h | EPIDURAL | Status: DC | PRN
  Administered 2022-11-16: 11 mL/h via EPIDURAL
  Filled 2022-11-16 (×2): qty 250

## 2022-11-16 MED ORDER — SODIUM CHLORIDE 0.9 % IV SOLN
500.0000 mg | INTRAVENOUS | Status: AC
  Administered 2022-11-16: 500 mg via INTRAVENOUS

## 2022-11-16 MED ORDER — SOD CITRATE-CITRIC ACID 500-334 MG/5ML PO SOLN: 30.0000 mL | ORAL | Status: DC

## 2022-11-16 MED ORDER — PENICILLIN G POT IN DEXTROSE 60000 UNIT/ML IV SOLN
3.0000 10*6.[IU] | INTRAVENOUS | Status: DC
  Administered 2022-11-16 (×2): 3 10*6.[IU] via INTRAVENOUS
  Filled 2022-11-16 (×5): qty 50

## 2022-11-16 MED ORDER — HYDROXYZINE HCL 50 MG PO TABS
25.0000 mg | ORAL_TABLET | Freq: Once | ORAL | Status: AC
  Administered 2022-11-16: 25 mg via ORAL
  Filled 2022-11-16: qty 1

## 2022-11-16 MED ORDER — TRANEXAMIC ACID-NACL 1000-0.7 MG/100ML-% IV SOLN
1000.0000 mg | Freq: Once | INTRAVENOUS | Status: AC
  Administered 2022-11-16: 1000 mg via INTRAVENOUS

## 2022-11-16 MED ORDER — DIPHENHYDRAMINE HCL 50 MG/ML IJ SOLN
12.5000 mg | INTRAMUSCULAR | Status: AC | PRN
  Administered 2022-11-16 (×3): 12.5 mg via INTRAVENOUS
  Filled 2022-11-16 (×3): qty 1

## 2022-11-16 SURGICAL SUPPLY — 43 items
ADH SKN CLS APL DERMABOND .7 (GAUZE/BANDAGES/DRESSINGS) ×1
APL PRP STRL LF DISP 70% ISPRP (MISCELLANEOUS) ×2
APL SKNCLS STERI-STRIP NONHPOA (GAUZE/BANDAGES/DRESSINGS) ×1
BENZOIN TINCTURE PRP APPL 2/3 (GAUZE/BANDAGES/DRESSINGS) ×1 IMPLANT
CANISTER WOUND CARE 500ML ATS (WOUND CARE) IMPLANT
CHLORAPREP W/TINT 26 (MISCELLANEOUS) ×2 IMPLANT
CLAMP UMBILICAL CORD (MISCELLANEOUS) ×1 IMPLANT
CLOTH BEACON ORANGE TIMEOUT ST (SAFETY) ×1 IMPLANT
DERMABOND ADVANCED .7 DNX12 (GAUZE/BANDAGES/DRESSINGS) ×1 IMPLANT
DRESSING PREVENA PLUS CUSTOM (GAUZE/BANDAGES/DRESSINGS) IMPLANT
DRSG OPSITE POSTOP 4X10 (GAUZE/BANDAGES/DRESSINGS) ×1 IMPLANT
DRSG PREVENA PLUS CUSTOM (GAUZE/BANDAGES/DRESSINGS) ×1
ELECT REM PT RETURN 9FT ADLT (ELECTROSURGICAL) ×1
ELECTRODE REM PT RTRN 9FT ADLT (ELECTROSURGICAL) ×1 IMPLANT
EXTRACTOR VACUUM KIWI (MISCELLANEOUS) IMPLANT
GAUZE SPONGE 4X4 12PLY STRL LF (GAUZE/BANDAGES/DRESSINGS) IMPLANT
GLOVE BIOGEL PI IND STRL 7.0 (GLOVE) ×3 IMPLANT
GLOVE ECLIPSE 6.5 STRL STRAW (GLOVE) ×1 IMPLANT
GOWN STRL REUS W/TWL LRG LVL3 (GOWN DISPOSABLE) ×3 IMPLANT
HEMOSTAT ARISTA ABSORB 3G PWDR (HEMOSTASIS) IMPLANT
KIT ABG SYR 3ML LUER SLIP (SYRINGE) IMPLANT
NDL HYPO 25X5/8 SAFETYGLIDE (NEEDLE) IMPLANT
NEEDLE HYPO 25X5/8 SAFETYGLIDE (NEEDLE) IMPLANT
NS IRRIG 1000ML POUR BTL (IV SOLUTION) ×1 IMPLANT
PACK C SECTION WH (CUSTOM PROCEDURE TRAY) ×1 IMPLANT
PAD ABD 7.5X8 STRL (GAUZE/BANDAGES/DRESSINGS) ×1 IMPLANT
PAD OB MATERNITY 4.3X12.25 (PERSONAL CARE ITEMS) ×1 IMPLANT
RTRCTR C-SECT PINK 25CM LRG (MISCELLANEOUS) ×1 IMPLANT
SPONGE T-LAP 18X18 ~~LOC~~+RFID (SPONGE) IMPLANT
SUT PLAIN 0 NONE (SUTURE) IMPLANT
SUT PLAIN 2 0 XLH (SUTURE) IMPLANT
SUT VIC AB 0 CT1 27 (SUTURE) ×2
SUT VIC AB 0 CT1 27XBRD ANBCTR (SUTURE) ×2 IMPLANT
SUT VIC AB 0 CTX 36 (SUTURE) ×3
SUT VIC AB 0 CTX36XBRD ANBCTRL (SUTURE) ×3 IMPLANT
SUT VIC AB 2-0 CT1 27 (SUTURE) ×1
SUT VIC AB 2-0 CT1 TAPERPNT 27 (SUTURE) ×1 IMPLANT
SUT VIC AB 3-0 SH 27 (SUTURE) ×1
SUT VIC AB 3-0 SH 27XBRD (SUTURE) ×1 IMPLANT
SUT VIC AB 4-0 KS 27 (SUTURE) ×1 IMPLANT
TOWEL OR 17X24 6PK STRL BLUE (TOWEL DISPOSABLE) ×1 IMPLANT
TRAY FOLEY W/BAG SLVR 14FR LF (SET/KITS/TRAYS/PACK) IMPLANT
WATER STERILE IRR 1000ML POUR (IV SOLUTION) ×1 IMPLANT

## 2022-11-16 NOTE — Progress Notes (Signed)
Patient ID: Sarah Hopkins, female   DOB: 2000-10-22, 22 y.o.   MRN: [id number]  Has been using nitrous with some relief; exhausted and wants to sleep  BPs 107/62 FHR 130s, +accels, no decels Ctx q 3-5 mins per pt with Pit at 69mu/min Cx unchanged (6cm/70/vtx -3)  IUP@39 .2wks Protracted labor, probably due to inadequate ctx  Discussed options including IV pain medication and epidural; requests Stadol/Phenergan for now to try to get some rest; will uptitrate Pit to try to make some labor progress  [name] CNM 11/16/2022 12:43 AM

## 2022-11-16 NOTE — Anesthesia Procedure Notes (Signed)
Epidural Patient location during procedure: OB Start time: 11/16/2022 1:32 AM End time: 11/16/2022 1:44 AM  Staffing Anesthesiologist: Mal Amabile, MD Performed: anesthesiologist   Preanesthetic Checklist Completed: patient identified, IV checked, site marked, risks and benefits discussed, surgical consent, monitors and equipment checked, pre-op evaluation and timeout performed  Epidural Patient position: sitting Prep: DuraPrep and site prepped and draped Patient monitoring: continuous pulse ox and blood pressure Approach: midline Location: L3-L4 Injection technique: LOR air  Needle:  Needle type: Tuohy  Needle gauge: 17 G Needle length: 9 cm and 9 Needle insertion depth: 7 cm Catheter type: closed end flexible Catheter size: 19 Gauge Catheter at skin depth: 13 cm Test dose: negative and Other  Assessment Events: blood not aspirated, no cerebrospinal fluid, injection not painful, no injection resistance, no paresthesia and negative IV test  Additional Notes Patient identified. Risks and benefits discussed including failed block, incomplete  Pain control, post dural puncture headache, nerve damage, paralysis, blood pressure Changes, nausea, vomiting, reactions to medications-both toxic and allergic and post Partum back pain. All questions were answered. Patient expressed understanding and wished to proceed. Sterile technique was used throughout procedure. Epidural site was Dressed with sterile barrier dressing. No paresthesias, signs of intravascular injection Or signs of intrathecal spread were encountered. Attempt x 3. Difficult due to MO, poor positioning and patient movement. Patient was more comfortable after the epidural was dosed. Please see RN's note for documentation of vital signs and FHR which are stable.]

## 2022-11-16 NOTE — Anesthesia Preprocedure Evaluation (Signed)
Anesthesia Evaluation  Patient identified by MRN, date of birth, ID band Patient awake    Reviewed: Allergy & Precautions, Patient's Chart, lab work & pertinent test results  Airway Mallampati: III  TM Distance: >3 FB     Dental  (+) Teeth Intact   Pulmonary neg pulmonary ROS   Pulmonary exam normal breath sounds clear to auscultation       Cardiovascular negative cardio ROS Normal cardiovascular exam Rhythm:Regular     Neuro/Psych negative neurological ROS  negative psych ROS   GI/Hepatic Neg liver ROS,GERD  Medicated,,  Endo/Other    Morbid obesity  Renal/GU negative Renal ROS  negative genitourinary   Musculoskeletal negative musculoskeletal ROS (+)    Abdominal  (+) + obese  Peds  Hematology  (+) Blood dyscrasia, anemia   Anesthesia Other Findings   Reproductive/Obstetrics (+) Pregnancy                              Anesthesia Physical Anesthesia Plan  ASA: 3  Anesthesia Plan: Epidural   Post-op Pain Management:    Induction:   PONV Risk Score and Plan:   Airway Management Planned: Natural Airway  Additional Equipment:   Intra-op Plan:   Post-operative Plan:   Informed Consent: I have reviewed the patients History and Physical, chart, labs and discussed the procedure including the risks, benefits and alternatives for the proposed anesthesia with the patient or authorized representative who has indicated his/her understanding and acceptance.       Plan Discussed with: Anesthesiologist  Anesthesia Plan Comments:          Anesthesia Quick Evaluation

## 2022-11-16 NOTE — Progress Notes (Signed)
Sarah Hopkins is a 21 y.o. G1P0000 at [redacted]w[redacted]d by LMP admitted for induction of labor due to PolyHydramnios.  Subjective: Patient doing well.   Objective: BP 120/64   Pulse 88   Temp 98.2 F (36.8 C) (Oral)   Resp 16   Ht 5\' 1"  (1.549 m)   Wt 108 kg   LMP 02/14/2022 (Exact Date)   BMI 44.99 kg/m  No intake/output data recorded. No intake/output data recorded.  FHT:  FHR: 135 bpm, variability: moderate,  accelerations:  Abscent,  decelerations:  Absent UC:   regular, every 2-3 minutes SVE:   Dilation: 6.5 Effacement (%): 90 Station: -1 Exam by:: Shay, CNM  Labs: Lab Results  Component Value Date   WBC 6.7 11/15/2022   HGB 11.0 (L) 11/15/2022   HCT 33.8 (L) 11/15/2022   MCV 83.3 11/15/2022   PLT 264 11/15/2022    Assessment / Plan: Induction of labor due to polyhydramnios,  progressing well on pitocin s/p IUPC placemen  Labor:  progressing with pitocin. Continue to titrate up as needed Fetal Wellbeing:  Category II- Continue to monitor while on Pit and with epidural  Pain Control:  Epidural I/D:   GBS positive < PCN  Anticipated MOD:  NSVD at this time.   Claudette Head, CNM 11/16/2022, 3:21 PM

## 2022-11-16 NOTE — Progress Notes (Signed)
Sarah Hopkins is a 22 y.o. G1P0000 at [redacted]w[redacted]d by LMP admitted for induction of labor due to Polyhydramnios.   Subjective: Patient doing well. FOB "kaleb" and Doula "kayla present. Introductions exchanged patient resting well since epidural placement.   Objective: BP (!) 99/51   Pulse 99   Temp 98.3 F (36.8 C) (Oral)   Resp 17   Ht 5\' 1"  (1.549 m)   Wt 108 kg   LMP 02/14/2022 (Exact Date)   BMI 44.99 kg/m  No intake/output data recorded. No intake/output data recorded.  FHT:  FHR: 135 bpm, variability: moderate,  accelerations:  Present,  decelerations:  Absent UC:   regular, every 5-7 minutes SVE:   Dilation: 8.5 Effacement (%): 90 Station: -1 Exam by:: Darleene Cleaver, RN  Labs: Lab Results  Component Value Date   WBC 6.7 11/15/2022   HGB 11.0 (L) 11/15/2022   HCT 33.8 (L) 11/15/2022   MCV 83.3 11/15/2022   PLT 264 11/15/2022   CNM to patient bedside to discuss IUPC placement. Reviewed risks and benefits with placement. Questions answered at bedside. Patient agree with plan of care. SVE performed 5.5-6/90/-1 S. Suzie Portela. FHT cat I prior to placement. CNM. IUPC placed without difficulty, clear fluid noted in the tube. FHT remained Cat I during and immediately post placement.   Assessment / Plan: Induction of labor due to Polyhydramnios ,  progressing well on pitocin  Labor:  IUPC placed. Continue to titrate pit up 2x2 as needed. Fetal Wellbeing:  Category I- continuous monitoring with epidural placement and Pit administration.  Pain Control:  Epidural I/D:   GBS positive < PCN  Anticipated MOD:  NSVD  Claudette Head, CNM 11/16/2022, 10:26 AM

## 2022-11-16 NOTE — Progress Notes (Signed)
Patient ID: Sarah Hopkins, female   DOB: [birth year], 22 y.o.   MRN: [id number]  Sitting up in thrones; c/o itching after Benadryl wore off; epidural working well  BP 103/62, 110/62 FHR 125-135, +accels, no decels, Ctx irreg 4-6 mins with Pit @ 89mu/min Cx per RN 8-9/90/vtx -1  IUP@39 .2wks Active labor  Atarax for itching Continue to uptitrate Pit to keep ctx regular Check cx in 2h or sooner prn pain/urge to push Anticipate vag delivery  [name] CNM 11/16/2022 5:56 AM

## 2022-11-16 NOTE — Progress Notes (Addendum)
VSS  Comfortable w/epidural,  FHR Cat 1.  IUPC doesn't appear to be reading well, replaced.  MVUs still only ~ 150, pitocin at 20 mu/min.  Cx 8/90/+1, very asynclitic.   At bedside to review management at this time, discussed concern for arrest of dilation due to asynclitic presentation.  Repeat SVE completed at 2245- no cervical change noted.  Discussed plan for primary C-section due to arrest of dilation.  Risk benefits and alternatives of cesarean section were discussed with the patient including but not limited to infection, bleeding, damage to bowel , bladder and baby with the need for further surgery. Also discussed risk of VTE including DVT or PE- will plan for Lovenox postpartum.  Pt voiced understanding and desires to proceed. OR notified, plan to proceed with ready.  Myna Hidalgo, DO Attending Obstetrician & Gynecologist, [facility] for [employer], [facility]

## 2022-11-17 ENCOUNTER — Encounter (HOSPITAL_COMMUNITY): Payer: Self-pay | Admitting: Family Medicine

## 2022-11-17 ENCOUNTER — Other Ambulatory Visit: Payer: Self-pay

## 2022-11-17 DIAGNOSIS — Z3A39 39 weeks gestation of pregnancy: Secondary | ICD-10-CM

## 2022-11-17 DIAGNOSIS — O403XX0 Polyhydramnios, third trimester, not applicable or unspecified: Secondary | ICD-10-CM

## 2022-11-17 DIAGNOSIS — O99820 Streptococcus B carrier state complicating pregnancy: Secondary | ICD-10-CM

## 2022-11-17 DIAGNOSIS — O99214 Obesity complicating childbirth: Secondary | ICD-10-CM

## 2022-11-17 DIAGNOSIS — N179 Acute kidney failure, unspecified: Secondary | ICD-10-CM | POA: Diagnosis not present

## 2022-11-17 LAB — BASIC METABOLIC PANEL
Anion gap: 6 (ref 5–15)
BUN: 6 mg/dL (ref 6–20)
CO2: 22 mmol/L (ref 22–32)
Calcium: 8.6 mg/dL — ABNORMAL LOW (ref 8.9–10.3)
Chloride: 106 mmol/L (ref 98–111)
Creatinine, Ser: 1.08 mg/dL — ABNORMAL HIGH (ref 0.44–1.00)
GFR, Estimated: >60 mL/min (ref >60)
Glucose, Bld: 117 mg/dL — ABNORMAL HIGH (ref 70–99)
Potassium: 4 mmol/L (ref 3.5–5.1)
Sodium: 134 mmol/L — ABNORMAL LOW (ref 135–145)

## 2022-11-17 LAB — CBC
HCT: 28.9 % — ABNORMAL LOW (ref 36.0–46.0)
Hemoglobin: 9.3 g/dL — ABNORMAL LOW (ref 12.0–15.0)
MCH: 27.4 pg (ref 26.0–34.0)
MCHC: 32.2 g/dL (ref 30.0–36.0)
MCV: 85 fL (ref 80.0–100.0)
Platelets: 223 10*3/uL (ref 150–400)
RBC: 3.4 MIL/uL — ABNORMAL LOW (ref 3.87–5.11)
RDW: 13.8 % (ref 11.5–15.5)
WBC: 16.6 10*3/uL — ABNORMAL HIGH (ref 4.0–10.5)
nRBC: 0 % (ref 0.0–0.2)

## 2022-11-17 LAB — CREATININE, SERUM
Creatinine, Ser: 1.58 mg/dL — ABNORMAL HIGH (ref 0.44–1.00)
GFR, Estimated: 48 mL/min — ABNORMAL LOW (ref >60)

## 2022-11-17 MED ORDER — FENTANYL CITRATE (PF) 100 MCG/2ML IJ SOLN
INTRAMUSCULAR | Status: DC | PRN
  Administered 2022-11-16: 100 ug via EPIDURAL

## 2022-11-17 MED ORDER — SIMETHICONE 80 MG PO CHEW: 80.0000 mg | CHEWABLE_TABLET | ORAL | Status: DC | PRN

## 2022-11-17 MED ORDER — MORPHINE SULFATE (PF) 0.5 MG/ML IJ SOLN
INTRAMUSCULAR | Status: DC | PRN
  Administered 2022-11-17: 3 mg via EPIDURAL

## 2022-11-17 MED ORDER — TETANUS-DIPHTH-ACELL PERTUSSIS 5-2.5-18.5 LF-MCG/0.5 IM SUSY: 0.5000 mL | PREFILLED_SYRINGE | Freq: Once | INTRAMUSCULAR | Status: DC

## 2022-11-17 MED ORDER — IBUPROFEN 600 MG PO TABS: 600.0000 mg | ORAL_TABLET | Freq: Four times a day (QID) | ORAL | Status: DC

## 2022-11-17 MED ORDER — HYDROMORPHONE HCL 1 MG/ML IJ SOLN: 0.2500 mg | INTRAMUSCULAR | Status: DC | PRN

## 2022-11-17 MED ORDER — OXYTOCIN-SODIUM CHLORIDE 30-0.9 UT/500ML-% IV SOLN
INTRAVENOUS | Status: DC | PRN
  Administered 2022-11-17: 30 [IU] via INTRAVENOUS

## 2022-11-17 MED ORDER — METOCLOPRAMIDE HCL 5 MG/ML IJ SOLN
INTRAMUSCULAR | Status: AC
  Filled 2022-11-17: qty 2

## 2022-11-17 MED ORDER — ENOXAPARIN SODIUM 60 MG/0.6ML IJ SOSY: 50.0000 mg | PREFILLED_SYRINGE | INTRAMUSCULAR | Status: DC

## 2022-11-17 MED ORDER — SODIUM CHLORIDE 0.9 % IV BOLUS: 1000.0000 mL | Freq: Once | INTRAVENOUS | Status: DC

## 2022-11-17 MED ORDER — DIPHENHYDRAMINE HCL 25 MG PO CAPS
25.0000 mg | ORAL_CAPSULE | Freq: Four times a day (QID) | ORAL | Status: DC | PRN
  Administered 2022-11-18 – 2022-11-19 (×2): 25 mg via ORAL

## 2022-11-17 MED ORDER — ONDANSETRON HCL 4 MG/2ML IJ SOLN
INTRAMUSCULAR | Status: DC | PRN
  Administered 2022-11-16: 4 mg via INTRAVENOUS

## 2022-11-17 MED ORDER — SODIUM CHLORIDE 0.9% FLUSH: 3.0000 mL | INTRAVENOUS | Status: DC | PRN

## 2022-11-17 MED ORDER — LIDOCAINE-EPINEPHRINE (PF) 2 %-1:200000 IJ SOLN
INTRAMUSCULAR | Status: DC | PRN
  Administered 2022-11-16 (×2): 5 mL via INTRADERMAL

## 2022-11-17 MED ORDER — OXYTOCIN-SODIUM CHLORIDE 30-0.9 UT/500ML-% IV SOLN: 2.5000 [IU]/h | INTRAVENOUS | Status: AC

## 2022-11-17 MED ORDER — DIPHENHYDRAMINE HCL 50 MG/ML IJ SOLN
12.5000 mg | INTRAMUSCULAR | Status: DC | PRN
  Administered 2022-11-17: 12.5 mg via INTRAVENOUS
  Filled 2022-11-17: qty 1

## 2022-11-17 MED ORDER — SCOPOLAMINE 1 MG/3DAYS TD PT72: 1.0000 | MEDICATED_PATCH | Freq: Once | TRANSDERMAL | Status: DC

## 2022-11-17 MED ORDER — COCONUT OIL OIL: 1.0000 | TOPICAL_OIL | Status: DC | PRN

## 2022-11-17 MED ORDER — DEXAMETHASONE SODIUM PHOSPHATE 4 MG/ML IJ SOLN
INTRAMUSCULAR | Status: DC | PRN
  Administered 2022-11-17: 4 mg via INTRAVENOUS

## 2022-11-17 MED ORDER — FERROUS SULFATE 325 (65 FE) MG PO TABS
325.0000 mg | ORAL_TABLET | ORAL | Status: DC
  Administered 2022-11-17: 325 mg via ORAL
  Filled 2022-11-17: qty 1

## 2022-11-17 MED ORDER — OXYCODONE HCL 5 MG PO TABS: 5.0000 mg | ORAL_TABLET | Freq: Four times a day (QID) | ORAL | Status: DC | PRN

## 2022-11-17 MED ORDER — ACETAMINOPHEN 10 MG/ML IV SOLN
INTRAVENOUS | Status: DC | PRN
  Administered 2022-11-17: 1000 mg via INTRAVENOUS

## 2022-11-17 MED ORDER — LACTATED RINGERS IV SOLN: INTRAVENOUS | Status: DC

## 2022-11-17 MED ORDER — PRENATAL MULTIVITAMIN CH
1.0000 | ORAL_TABLET | Freq: Every day | ORAL | Status: DC
  Administered 2022-11-18 – 2022-11-19 (×2): 1 via ORAL
  Filled 2022-11-17 (×2): qty 1

## 2022-11-17 MED ORDER — DIPHENHYDRAMINE HCL 25 MG PO CAPS
25.0000 mg | ORAL_CAPSULE | ORAL | Status: DC | PRN
  Administered 2022-11-17 (×2): 25 mg via ORAL
  Filled 2022-11-17 (×6): qty 1

## 2022-11-17 MED ORDER — ACETAMINOPHEN 10 MG/ML IV SOLN
INTRAVENOUS | Status: AC
  Filled 2022-11-17: qty 100

## 2022-11-17 MED ORDER — SODIUM CHLORIDE 0.9 % IR SOLN
Status: DC | PRN
  Administered 2022-11-17: 1

## 2022-11-17 MED ORDER — KETOROLAC TROMETHAMINE 30 MG/ML IJ SOLN
INTRAMUSCULAR | Status: AC
  Filled 2022-11-17: qty 1

## 2022-11-17 MED ORDER — NALOXONE HCL 4 MG/10ML IJ SOLN: 1.0000 ug/kg/h | INTRAVENOUS | Status: DC | PRN

## 2022-11-17 MED ORDER — SCOPOLAMINE 1 MG/3DAYS TD PT72
MEDICATED_PATCH | TRANSDERMAL | Status: DC | PRN
  Administered 2022-11-16: 1 via TRANSDERMAL

## 2022-11-17 MED ORDER — DEXAMETHASONE SODIUM PHOSPHATE 4 MG/ML IJ SOLN
INTRAMUSCULAR | Status: AC
  Filled 2022-11-17: qty 1

## 2022-11-17 MED ORDER — MEPERIDINE HCL 25 MG/ML IJ SOLN: 6.2500 mg | INTRAMUSCULAR | Status: DC | PRN

## 2022-11-17 MED ORDER — KETOROLAC TROMETHAMINE 30 MG/ML IJ SOLN
30.0000 mg | Freq: Four times a day (QID) | INTRAMUSCULAR | Status: DC
  Filled 2022-11-17: qty 1

## 2022-11-17 MED ORDER — OXYTOCIN-SODIUM CHLORIDE 30-0.9 UT/500ML-% IV SOLN
INTRAVENOUS | Status: AC
  Filled 2022-11-17: qty 500

## 2022-11-17 MED ORDER — DIBUCAINE (PERIANAL) 1 % EX OINT: 1.0000 | TOPICAL_OINTMENT | CUTANEOUS | Status: DC | PRN

## 2022-11-17 MED ORDER — ONDANSETRON HCL 4 MG/2ML IJ SOLN: 4.0000 mg | Freq: Three times a day (TID) | INTRAMUSCULAR | Status: DC | PRN

## 2022-11-17 MED ORDER — MEDROXYPROGESTERONE ACETATE 150 MG/ML IM SUSP: 150.0000 mg | INTRAMUSCULAR | Status: DC | PRN

## 2022-11-17 MED ORDER — DIPHENHYDRAMINE HCL 50 MG/ML IJ SOLN
INTRAMUSCULAR | Status: AC
  Filled 2022-11-17: qty 1

## 2022-11-17 MED ORDER — ACETAMINOPHEN 500 MG PO TABS
1000.0000 mg | ORAL_TABLET | Freq: Four times a day (QID) | ORAL | Status: AC
  Administered 2022-11-17 (×2): 1000 mg via ORAL
  Filled 2022-11-17 (×4): qty 2

## 2022-11-17 MED ORDER — KETOROLAC TROMETHAMINE 30 MG/ML IJ SOLN: 30.0000 mg | Freq: Four times a day (QID) | INTRAMUSCULAR | Status: DC | PRN

## 2022-11-17 MED ORDER — KETOROLAC TROMETHAMINE 30 MG/ML IJ SOLN
30.0000 mg | Freq: Four times a day (QID) | INTRAMUSCULAR | Status: DC | PRN
  Administered 2022-11-17: 30 mg via INTRAMUSCULAR

## 2022-11-17 MED ORDER — ONDANSETRON HCL 4 MG/2ML IJ SOLN
INTRAMUSCULAR | Status: AC
  Filled 2022-11-17: qty 2

## 2022-11-17 MED ORDER — STERILE WATER FOR IRRIGATION IR SOLN
Status: DC | PRN
  Administered 2022-11-17: 1

## 2022-11-17 MED ORDER — NALOXONE HCL 0.4 MG/ML IJ SOLN: 0.4000 mg | INTRAMUSCULAR | Status: DC | PRN

## 2022-11-17 MED ORDER — WITCH HAZEL-GLYCERIN EX PADS: 1.0000 | MEDICATED_PAD | CUTANEOUS | Status: DC | PRN

## 2022-11-17 MED ORDER — KETOROLAC TROMETHAMINE 30 MG/ML IJ SOLN: 30.0000 mg | Freq: Four times a day (QID) | INTRAMUSCULAR | Status: DC

## 2022-11-17 MED ORDER — PROMETHAZINE HCL 25 MG/ML IJ SOLN: 6.2500 mg | INTRAMUSCULAR | Status: DC | PRN

## 2022-11-17 MED ORDER — ENOXAPARIN SODIUM 60 MG/0.6ML IJ SOSY
50.0000 mg | PREFILLED_SYRINGE | INTRAMUSCULAR | Status: DC
  Administered 2022-11-17 – 2022-11-18 (×2): 50 mg via SUBCUTANEOUS
  Filled 2022-11-17 (×2): qty 0.6

## 2022-11-17 MED ORDER — SENNOSIDES-DOCUSATE SODIUM 8.6-50 MG PO TABS
2.0000 | ORAL_TABLET | Freq: Every day | ORAL | Status: DC
  Administered 2022-11-18 – 2022-11-19 (×2): 2 via ORAL
  Filled 2022-11-17 (×2): qty 2

## 2022-11-17 MED ORDER — DIPHENHYDRAMINE HCL 50 MG/ML IJ SOLN
INTRAMUSCULAR | Status: DC | PRN
  Administered 2022-11-17: 12.5 mg via INTRAVENOUS

## 2022-11-17 MED ORDER — TRANEXAMIC ACID-NACL 1000-0.7 MG/100ML-% IV SOLN
INTRAVENOUS | Status: AC
  Filled 2022-11-17: qty 100

## 2022-11-17 MED ORDER — METOCLOPRAMIDE HCL 5 MG/ML IJ SOLN
INTRAMUSCULAR | Status: DC | PRN
  Administered 2022-11-17: 10 mg via INTRAVENOUS

## 2022-11-17 MED ORDER — OXYCODONE HCL 5 MG PO TABS
5.0000 mg | ORAL_TABLET | ORAL | Status: DC | PRN
  Administered 2022-11-18: 5 mg via ORAL
  Administered 2022-11-18 – 2022-11-19 (×5): 10 mg via ORAL
  Administered 2022-11-19: 5 mg via ORAL
  Administered 2022-11-19: 10 mg via ORAL
  Filled 2022-11-17 (×2): qty 2
  Filled 2022-11-17: qty 1
  Filled 2022-11-17 (×6): qty 2

## 2022-11-17 MED ORDER — MENTHOL 3 MG MT LOZG: 1.0000 | LOZENGE | OROMUCOSAL | Status: DC | PRN

## 2022-11-17 MED ORDER — ACETAMINOPHEN 325 MG PO TABS
650.0000 mg | ORAL_TABLET | ORAL | Status: DC | PRN
  Administered 2022-11-18 – 2022-11-19 (×8): 650 mg via ORAL
  Filled 2022-11-17 (×9): qty 2

## 2022-11-17 MED ORDER — MEASLES, MUMPS & RUBELLA VAC IJ SOLR: 0.5000 mL | Freq: Once | INTRAMUSCULAR | Status: DC

## 2022-11-17 MED ORDER — KETOROLAC TROMETHAMINE 30 MG/ML IJ SOLN: 30.0000 mg | Freq: Once | INTRAMUSCULAR | Status: DC | PRN

## 2022-11-17 MED ORDER — SCOPOLAMINE 1 MG/3DAYS TD PT72
MEDICATED_PATCH | TRANSDERMAL | Status: AC
  Filled 2022-11-17: qty 1

## 2022-11-17 MED ORDER — SIMETHICONE 80 MG PO CHEW
80.0000 mg | CHEWABLE_TABLET | Freq: Three times a day (TID) | ORAL | Status: DC
  Administered 2022-11-17 – 2022-11-19 (×6): 80 mg via ORAL
  Filled 2022-11-17 (×6): qty 1

## 2022-11-17 MED ORDER — SODIUM CHLORIDE 0.9 % IV SOLN: INTRAVENOUS | Status: DC | PRN

## 2022-11-17 NOTE — Transfer of Care (Signed)
Immediate Anesthesia Transfer of Care Note  Patient: Sarah Hopkins  Procedure(s) Performed: CESAREAN SECTION (Abdomen) APPLICATION OF WOUND VAC (Abdomen)  Patient Location: PACU  Anesthesia Type:Epidural  Level of Consciousness: awake, alert , and oriented  Airway & Oxygen Therapy: Patient Spontanous Breathing  Post-op Assessment: Report given to RN and Post -op Vital signs reviewed and stable  Post vital signs: Reviewed and stable  Last Vitals:  Vitals Value Taken Time  BP    Temp    Pulse    Resp    SpO2      Last Pain:  Vitals:   11/16/22 2013  TempSrc: Oral  PainSc:          Complications: No notable events documented.

## 2022-11-17 NOTE — Discharge Summary (Signed)
Postpartum Discharge Summary  Date of Service updated***     Patient Name: Sarah Hopkins DOB: 2000/06/20 MRN: [id number]  Date of admission: 11/15/2022 Delivery date:11/17/2022 Delivering provider: Myna Hidalgo Date of discharge: 11/17/2022  Admitting diagnosis: Encounter for planned induction of labor [Z34.90] Intrauterine pregnancy: [redacted]w[redacted]d     Secondary diagnosis:  Principal Problem:   Encounter for planned induction of labor Polyhydramnios  Active Problems:   Obesity affecting pregnancy   GBS (group B Streptococcus carrier), +RV culture, currently pregnant   Polyhydramnios  Additional problems: ***    Discharge diagnosis: {DX.:23714}                                              Post partum procedures:{Postpartum procedures:23558} Augmentation: AROM, Pitocin, Cytotec, and IP Foley Complications: None  Hospital course: Induction of Labor With Cesarean Section   22 y.o. yo G1P1001 at [redacted]w[redacted]d was admitted to the hospital 11/15/2022 for induction of labor. Patient induced with cytotec followed by Pit/AROM.  IUPC was placed to confirm adequate contractions.  Max dilation of 8cm, significant asynclitic presentation noted.  The patient went for cesarean section due to  arrest of dilation . Delivery details are as follows: Membrane Rupture Time/Date: 4:44 PM,11/15/2022  Delivery Method:C-Section, Low Transverse Details of operation can be found in separate operative Note.  Patient had a postpartum course complicated by***. She is ambulating, tolerating a regular diet, passing flatus, and urinating well.  Patient is discharged home in stable condition on 11/17/22.      Newborn Data: Birth date:11/17/2022 Birth time:12:16 AM Gender:Female Living status:Living Apgars:8 ,9  Weight:3660 g                               Magnesium Sulfate received: {Mag received:30440022} BMZ received: {BMZ received:30440023} Rhophylac:{Rhophylac  received:30440032} WUJ:{WJX:91478295} T-DaP:{Tdap:23962} Flu: {AOZ:30865} Transfusion:{Transfusion received:30440034}  Physical exam  Vitals:   11/17/22 0105 11/17/22 0110 11/17/22 0115 11/17/22 0120  BP: 123/64  113/76   Pulse: 100 99 98 92  Resp: (!) 22 18 16 19   Temp:      TempSrc:      SpO2: 98% 100% 98% 98%  Weight:      Height:       General: {Exam; general:21111117} Lochia: {Desc; appropriate/inappropriate:30686::"appropriate"} Uterine Fundus: {Desc; firm/soft:30687} Incision: {Exam; incision:21111123} DVT Evaluation: {Exam; dvt:2111122} Labs: Lab Results  Component Value Date   WBC 6.7 11/15/2022   HGB 11.0 (L) 11/15/2022   HCT 33.8 (L) 11/15/2022   MCV 83.3 11/15/2022   PLT 264 11/15/2022       No data to display         Edinburgh Score:     No data to display           After visit meds:  Allergies as of 11/17/2022       Reactions   Prednisone Anaphylaxis   Other reaction(s): Cramps (ALLERGY/intolerance) Insomnia, severe Depression/moody     Med Rec must be completed prior to using this SMARTLINK***        Discharge home in stable condition Infant Feeding: {Baby feeding:23562} Infant Disposition:{CHL IP OB HOME WITH HQIONG:29528} Discharge instruction: per After Visit Summary and Postpartum booklet. Activity: Advance as tolerated. Pelvic rest for 6 weeks.  Diet: {OB UXLK:44010272} Future Appointments:No future appointments. Follow up Visit:   Please  schedule this patient for a {Visit type:23955} postpartum visit in {Postpartum visit:23953} with the following provider: {Provider type:23954}. Additional Postpartum F/U:{PP Procedure:23957}  {Risk level:23960} pregnancy complicated by: {complication:23959} Delivery mode:  C-Section, Low Transverse Anticipated Birth Control:  {Birth Control:23956}   11/17/2022 Sharon Seller, DO

## 2022-11-17 NOTE — Op Note (Signed)
PreOp Diagnosis: 1) Arrest of dilation  2) Intrauterine pregnancy @ 39wd  3) Morbid obesity  PostOp Diagnosis: same Procedure: Primary C-section Surgeon: Dr. Myna Hidalgo Anesthesia: epidural Complications: none EBL: 1149cc UOP: 300cc Fluids: 1700cc  Findings: Female infant- asynclitic- likely transverse presentation.  Normal uterus, tubes and ovaries bilaterally.  PROCEDURE:  Informed consent was obtained from the patient with risks, benefits, complications, treatment options, and expected outcomes discussed with the patient.  The patient concurred with the proposed plan, giving informed consent with form signed.   The patient was taken to Operating Room, and identified with the procedure verified as C-Section Delivery with Time Out. With induction of anesthesia, the patient was prepped and draped in the usual sterile fashion. A Pfannenstiel incision was made and carried down through the subcutaneous tissue to the fascia. The fascia was incised in the midline and extended transversely. The superior aspect of the fascial incision was grasped with Kochers elevated and the underlying muscle dissected off. The inferior aspect of the facial incision was in similar fashion, grasped elevated and rectus muscles dissected off. The peritoneum was identified and entered. Peritoneal incision was extended longitudinally. Alexis retractor was placed.   The utero-vesical peritoneal reflection was identified and incised transversely with the [facility] scissors, the incision extended laterally, the bladder flap created digitally. A low transverse uterine incision was made and the infants head delivered atraumatically. After the umbilical cord was clamped and cut cord blood was obtained for evaluation.   The placenta was removed intact and appeared normal. The uterine outline, tubes and ovaries appeared normal. The uterine incision was closed with running locked sutures of 0 Vicryl and a second layer of the same stitch  was used in an imbricating fashion.  Excellent hemostasis was obtained. Alexis retractor was removed.  Peritoneum was closed in a running fashion. The fascia was then reapproximated with running sutures of 0 Vicryl. The subcutaneous tissue was reapproximated with 2-0 plain gut suture.  The skin was closed with 4-0 vicryl in a subcuticular fashion.  Instrument, sponge, and needle counts were correct prior the abdominal closure and at the conclusion of the case. The patient was taken to recovery in stable condition.  Myna Hidalgo, DO Attending Obstetrician & Gynecologist, [facility] for [employer], [facility]

## 2022-11-17 NOTE — Social Work (Signed)
CSW received consult for hx of marijuana use.  Referral was screened out due to the following:  ~MOB had no documented substance use after initial prenatal visit/+UPT. ~MOB had no positive drug screens after initial prenatal visit/+UPT. ~Baby's UDS is negative.  Per chart review MOB's last use was December 2023, prior to confirmed pregnancy.  Please consult CSW if current concerns arise or by MOB's request.  CSW will monitor CDS results and make report to Child Protective Services if warranted.  Wende Neighbors, LCSWA Clinical Social Worker [phone]

## 2022-11-18 LAB — BASIC METABOLIC PANEL
Anion gap: 6 (ref 5–15)
BUN: 5 mg/dL — ABNORMAL LOW (ref 6–20)
CO2: 24 mmol/L (ref 22–32)
Calcium: 8.5 mg/dL — ABNORMAL LOW (ref 8.9–10.3)
Chloride: 106 mmol/L (ref 98–111)
Creatinine, Ser: 0.91 mg/dL (ref 0.44–1.00)
GFR, Estimated: >60 mL/min (ref >60)
Glucose, Bld: 75 mg/dL (ref 70–99)
Potassium: 4.2 mmol/L (ref 3.5–5.1)
Sodium: 136 mmol/L (ref 135–145)

## 2022-11-18 LAB — CBC
HCT: 24.3 % — ABNORMAL LOW (ref 36.0–46.0)
Hemoglobin: 8 g/dL — ABNORMAL LOW (ref 12.0–15.0)
MCH: 27.8 pg (ref 26.0–34.0)
MCHC: 32.9 g/dL (ref 30.0–36.0)
MCV: 84.4 fL (ref 80.0–100.0)
Platelets: 201 10*3/uL (ref 150–400)
RBC: 2.88 MIL/uL — ABNORMAL LOW (ref 3.87–5.11)
RDW: 14 % (ref 11.5–15.5)
WBC: 12.5 10*3/uL — ABNORMAL HIGH (ref 4.0–10.5)
nRBC: 0 % (ref 0.0–0.2)

## 2022-11-18 MED ORDER — GABAPENTIN 100 MG PO CAPS
100.0000 mg | ORAL_CAPSULE | Freq: Two times a day (BID) | ORAL | Status: DC
  Administered 2022-11-18 – 2022-11-19 (×3): 100 mg via ORAL
  Filled 2022-11-18 (×3): qty 1

## 2022-11-18 MED ORDER — FERROUS SULFATE 325 (65 FE) MG PO TABS
325.0000 mg | ORAL_TABLET | Freq: Every day | ORAL | Status: DC
  Administered 2022-11-19: 325 mg via ORAL
  Filled 2022-11-18: qty 1

## 2022-11-18 NOTE — Lactation Note (Signed)
This note was copied from a baby's chart. Lactation Consultation Note  Patient Name: Sarah Hopkins [id number] Date: 11/18/2022 Age:21 hours Reason for consult: Initial assessment;1st time breastfeeding;Primapara;Term. Per Birth Parent, infant finished BF for 15 minutes as LC was entering the room. Birth Parent feels infant is latching well no concerns at this time, infant had 5 voids and 5 stools since birth. Birth Parent will continue to BF infant according to hunger cues, on demand, 8 to 12+ times within 24 hours, skin to skin. Birth Parent knows to call RN/ LC for latch assistance if needed. LC discussed importance of maternal rest, diet and hydration. Birth Parent was made aware of O/P services, breastfeeding support groups, community resources, and our phone # for post-discharge questions.   Maternal Data Has patient been taught Hand Expression?: Yes  Feeding Mother's Current Feeding Choice: Breast Milk  LATCH Score  LC did not observe latch infant finished BF as LC walked in room.                  Lactation Tools Discussed/Used    Interventions Interventions: Position options;Skin to skin;Breast feeding basics reviewed;LC Services brochure;Education  Discharge Pump: DEBP;Personal  Consult Status Consult Status: Follow-up Date: 11/18/22 Follow-up type: In-patient    Frederico Hamman 11/18/2022, 12:21 AM

## 2022-11-18 NOTE — Lactation Note (Signed)
This note was copied from a baby's chart. Lactation Consultation Note  Patient Name: Sarah Hopkins Date: 11/18/2022 Age:21 hours  LC entered the family's room Birth Parent asleep at this time. LC attempt to follow up later with Birth Parent.    Maternal Data    Feeding    LATCH Score                    Lactation Tools Discussed/Used    Interventions    Discharge    Consult Status      Frederico Hamman 11/18/2022, 10:01 PM

## 2022-11-18 NOTE — Progress Notes (Addendum)
POSTPARTUM PROGRESS NOTE  POD #1  Subjective:  Sarah Hopkins is a 22 y.o. G1P1001 s/p pLTCS at [redacted]w[redacted]d.  She reports she doing well. No acute events overnight. She reports she is doing well. She denies any problems with ambulating, voiding or po intake. Denies nausea or vomiting. She has passed flatus. Pain is moderately controlled.  Lochia is appropriate.  Objective: Blood pressure (!) 117/52, pulse 86, temperature 97.6 F (36.4 C), temperature source Oral, resp. rate 16, height 5\' 1"  (1.549 m), weight 108 kg, last menstrual period 02/14/2022, SpO2 100%, unknown if currently breastfeeding.  Physical Exam:  General: alert, cooperative and no distress Chest: no respiratory distress Heart:regular rate, distal pulses intact Abdomen: soft, nontender,  Uterine Fundus: firm, appropriately tender DVT Evaluation: No calf swelling or tenderness Extremities: No LE edema Skin: warm, dry; incision clean/dry/intact w/ honeycomb dressing in place  Recent Labs    11/17/22 0510 11/18/22 0520  HGB 9.3* 8.0*  HCT 28.9* 24.3*    Assessment/Plan: Sarah Hopkins is a 22 y.o. G1P1001 s/p pLTCS at [redacted]w[redacted]d for failed induction.  POD#1 - Doing well; pain moderatealy controlled. Increase oxycodone to 10 mg. Start gabapentin.   Routine postpartum care  OOB, ambulated  Lovenox for VTE prophylaxis Anemia: asymptomatic  Increase PO iron from every other day to daily.  Contraception: None Feeding: Breast  Dispo: Plan for discharge 7/14-7/15.   LOS: 3 days   Lavonda Jumbo, DO OB Fellow, Faculty [facility], Center for [facility] 11/18/2022, 9:36 AM

## 2022-11-19 ENCOUNTER — Encounter: Payer: Self-pay | Admitting: Obstetrics & Gynecology

## 2022-11-19 DIAGNOSIS — D62 Acute posthemorrhagic anemia: Secondary | ICD-10-CM | POA: Diagnosis not present

## 2022-11-19 MED ORDER — ACETAMINOPHEN 500 MG PO TABS: 1000.0000 mg | ORAL_TABLET | Freq: Four times a day (QID) | ORAL | 2 refills | Status: AC | PRN

## 2022-11-19 MED ORDER — IBUPROFEN 600 MG PO TABS: 600.0000 mg | ORAL_TABLET | Freq: Four times a day (QID) | ORAL | 2 refills | Status: AC | PRN

## 2022-11-19 MED ORDER — SENNOSIDES-DOCUSATE SODIUM 8.6-50 MG PO TABS: 2.0000 | ORAL_TABLET | Freq: Every evening | ORAL | 2 refills | Status: AC | PRN

## 2022-11-19 MED ORDER — OXYCODONE HCL 5 MG PO TABS: 5.0000 mg | ORAL_TABLET | Freq: Four times a day (QID) | ORAL | 0 refills | Status: AC | PRN

## 2022-11-19 NOTE — Lactation Note (Signed)
This note was copied from a baby's chart. Lactation Consultation Note  Patient Name: Sarah Hopkins Date: 11/19/2022 Age:21 hours Reason for consult: Follow-up assessment;Primapara;1st time breastfeeding;Term;Infant weight loss;Breastfeeding assistance;Nipple pain/trauma (7.79% WL)  The infant was at 62 hours old.  Per the birth parent things are going well with breastfeeding but the latch is not as deep as she would like.  The infant was being circumcised and was not in the room.  The birth parent stated that she is starting to feel some fullness in her breast.  The birth parent hand expressed and milk sprayed out of her left breast.  She commented that she had asked to be set up with a DEBP yesterday but was told she wouldn't get anything.  LC encouraged the birth parent to pump after every other feeding and feed the expressed milk to the infant.  LC set up the DEBP per her request and reviewed washing and drying, assembling and disassembling, milk storage guidelines, and pumping frequency.  The birth parent pumped for 5 min with a size #21 flange and got 8mL. The infant returned to the room and latched to both breasts with his tongue down, lips flanged, and audible swallowing was noted. LC gave the birth parent a supplementation sheet so she would know how much to supplement the infant with after breastfeeding.  LC gave the birth parent coconut oil and reviewed engorgement, mastitis, warning signs, infant I/O, breast care, and outpatient services.  The birth parent was also given breast pads due to her breasts leaking.  All questions were answered.   Infant Feeding Plan: Breastfeed according to feeding cues 8+ times in 24 hours.  Pump after every other feeding and use the expressed milk to supplement the infant.  Call RN/LC for assistance with breastfeeding.    Feeding Mother's Current Feeding Choice: Breast Milk  LATCH Score Latch: Grasps breast easily, tongue down,  lips flanged, rhythmical sucking.  Audible Swallowing: Spontaneous and intermittent  Type of Nipple: Flat  Comfort (Breast/Nipple): Filling, red/small blisters or bruises, mild/mod discomfort  Hold (Positioning): No assistance needed to correctly position infant at breast.  LATCH Score: 8   Lactation Tools Discussed/Used Tools: Pump;Flanges;Coconut oil Flange Size: 21 Breast pump type: Double-Electric Breast Pump Pump Education: Setup, frequency, and cleaning;Milk Storage Reason for Pumping: Birth parent's request Pumping frequency: As needed/every other feeding Pumped volume: 8 mL  Interventions Interventions: Support pillows;DEBP;Education;Coconut oil  Discharge Discharge Education: Engorgement and breast care;Warning signs for feeding baby;Outpatient recommendation  Consult Status Consult Status: Follow-up Date: 11/20/22 Follow-up type: In-patient   Delene Loll 11/19/2022, 12:14 PM

## 2022-11-20 ENCOUNTER — Inpatient Hospital Stay (HOSPITAL_COMMUNITY)
Admission: AD | Admit: 2022-11-20 | Discharge: 2022-11-20 | Disposition: A | Discharge status: Home / Self Care | Payer: Medicaid Other | Attending: Family Medicine | Admitting: Family Medicine

## 2022-11-20 ENCOUNTER — Encounter: Payer: Self-pay | Admitting: Certified Nurse Midwife

## 2022-11-20 DIAGNOSIS — Z4889 Encounter for other specified surgical aftercare: Secondary | ICD-10-CM | POA: Insufficient documentation

## 2022-11-20 DIAGNOSIS — Z5189 Encounter for other specified aftercare: Secondary | ICD-10-CM | POA: Diagnosis not present

## 2022-11-20 NOTE — MAU Provider Note (Signed)
Event Date/Time   First Provider Initiated Contact with Patient 11/20/22 Rickey Primus      S Sarah Hopkins is a 22 y.o. G1P1001 patient who presents to MAU today with complaint of concerns of c/s incision. The honeycomb dressing is starting to come up and she noticed some pink tinge on her dressing. No increased pain or bleeding   O BP 131/73 (BP Location: Right Arm)   Pulse 76   Temp 98 F (36.7 C) (Oral)   Resp 18   SpO2 100%   Breastfeeding Yes Comment: baby is good (big smile) Physical Exam Vitals and nursing note reviewed.  Constitutional:      Appearance: Normal appearance.  Abdominal:     Comments: Healing Pfannenstiel incision that is clean and dry.  The edges are well-approximated.  There is no induration or erythema.  Neurological:     Mental Status: She is alert.  Psychiatric:        Mood and Affect: Mood normal.        Behavior: Behavior normal.        Thought Content: Thought content normal.        Judgment: Judgment normal.     A 1. Encounter for wound re-check      P Reassured patient that the incision looks normal.  No concerns of infection.  Discussed wound care.  Patient to follow-up in the office next week.  Return precautions given  Levie Heritage, DO 11/20/2022 6:26 PM

## 2022-11-20 NOTE — MAU Note (Signed)
Sarah Hopkins is a 22 y.o. at Unknown here in MAU reporting: c/s on 7/12, dc'd on 7/14. Has not peach colored fluid  leaking, the dressing is loose in the middle. Doesn't know if this is normal or is upright.   Is in pain, doesn't know if the meds are working.  Onset of complaint: 1500 Pain score: 7, incision, worse on the rt side Vitals:   11/20/22 1816  BP: 131/73  Pulse: 76  Resp: 18  Temp: 98 F (36.7 C)  SpO2: 100%     Lab orders placed from triage:

## 2022-11-22 ENCOUNTER — Encounter: Payer: Medicaid Other | Admitting: Obstetrics and Gynecology

## 2022-11-23 NOTE — Anesthesia Postprocedure Evaluation (Signed)
Anesthesia Post Note  Patient: Sarah Hopkins  Procedure(s) Performed: CESAREAN SECTION (Abdomen)     Patient location during evaluation: PACU Anesthesia Type: Epidural Level of consciousness: awake Pain management: pain level controlled Vital Signs Assessment: post-procedure vital signs reviewed and stable Cardiovascular status: stable Postop Assessment: no headache, no backache, epidural receding, patient able to bend at knees and no apparent nausea or vomiting Anesthetic complications: no  No notable events documented.  Last Vitals:  Vitals:   11/18/22 2023 11/19/22 0423  BP: 135/82 110/60  Pulse: 99 87  Resp: 17 18  Temp: 36.7 C 36.8 C  SpO2: 99% 100%    Last Pain:  Vitals:   11/19/22 1257  TempSrc:   PainSc: 5    Pain Goal:                   Caren Macadam

## 2022-11-29 ENCOUNTER — Encounter: Payer: Self-pay | Admitting: *Deleted

## 2022-11-29 ENCOUNTER — Ambulatory Visit (INDEPENDENT_AMBULATORY_CARE_PROVIDER_SITE_OTHER): Payer: Medicaid Other | Admitting: *Deleted

## 2022-11-29 DIAGNOSIS — D508 Other iron deficiency anemias: Secondary | ICD-10-CM

## 2022-11-29 MED ORDER — ACCRUFER 30 MG PO CAPS
1.0000 | ORAL_CAPSULE | Freq: Two times a day (BID) | ORAL | 1 refills | Status: AC
Start: 2022-11-29 — End: ?

## 2022-11-29 NOTE — Progress Notes (Signed)
Subjective:     Sarah Hopkins is a 22 y.o. female who presents to the clinic 2 weeks status post classical cesarean section. Pt reports incision is healing well.      Objective:    LMP 02/14/2022 (Exact Date)  General:  alert, well appearing, in no apparent distress, oriented to person, place and time, overweight  Incision:   healing well, no drainage, no erythema, no hernia, no seroma, no swelling, well approximated, no dehiscence, incision well approximated     Assessment:    Doing well postoperatively.   Plan:    1. Continue any current medications. 2. Wound care discussed. 3. Follow up: as scheduled for postpartum visit.   Harrel Lemon, RN

## 2023-01-01 ENCOUNTER — Encounter: Payer: Self-pay | Admitting: Obstetrics and Gynecology

## 2023-01-01 ENCOUNTER — Telehealth (INDEPENDENT_AMBULATORY_CARE_PROVIDER_SITE_OTHER): Payer: Medicaid Other | Admitting: Obstetrics and Gynecology

## 2023-01-01 DIAGNOSIS — D62 Acute posthemorrhagic anemia: Secondary | ICD-10-CM

## 2023-01-01 NOTE — Progress Notes (Signed)
Post Partum Visit Note [facility]: [facility]   Patient location: Home  I connected with Sarah Hopkins on 01/03/23 at  3:50 PM EDT by Mychart Video Encounter and verified that I am speaking with the correct person using two identifiers.       I discussed the limitations, risks, security and privacy concerns of performing an evaluation and management service virtually and the availability of in person appointments. I also discussed with the patient that there may be a patient responsible charge related to this service. The patient expressed understanding and agreed to proceed.  Sarah Hopkins is a 22 y.o. G36P1001 female who presents for a postpartum visit. She is 6 weeks postpartum following a primary cesarean section.  I have fully reviewed the prenatal and intrapartum course. The delivery was at 39 gestational weeks.  Anesthesia: epidural. Postpartum course has been good. Baby is doing well. Baby is feeding by both breast and bottle - Similac Advance. Bleeding thick, heavy lochia. Using 8 pads per day. No overflow. Quarter sized clots. Bowel function is normal. Bladder function is normal. Patient is sexually active. Contraception method is condoms. Postpartum depression screening: negative.  The pregnancy intention screening data noted above was reviewed. Potential methods of contraception were discussed. The patient elected to proceed with condoms.    Health Maintenance Due  Topic Date Due   HPV VACCINES (1 - 3-dose series) Never done   COVID-19 Vaccine (1 - 2023-24 season) Never done   INFLUENZA VACCINE  12/07/2022    The following portions of the patient's history were reviewed and updated as appropriate: allergies, current medications, past family history, past medical history, past social history, past surgical history, and problem list.  Review of Systems Pertinent items are noted in HPI.  Objective:  There were no vitals taken for  this visit.   General:  alert, cooperative, appears stated age, and no distress   Breasts:  not indicated  Lungs: Breathing comfortably during conversation of virtual visit  Heart:  N/a  Abdomen: N/a  Wound N/a  GU exam: N/a       Assessment:   1. Postpartum examination following cesarean delivery 2. Acute blood loss as cause of postoperative anemia Encouraged follow up CBC this week, patient can do a Tuesday nursing visit for blood work.   Unable to perform postpartum exam.   Discussed bleeding precautions, given heavy menses and passage of clots.   I provided 15 minutes of face-to-face time during this encounter.  Plan:   Essential components of care per ACOG recommendations:  1.  Mood and well being: Patient with positive depression screening today. Reviewed local resources for support.  - Patient tobacco use? No.   - hx of drug use? No.    2. Infant care and feeding:  -Patient currently breastmilk feeding? Yes. Reviewed importance of draining breast regularly to support lactation.  -Social determinants of health (SDOH) reviewed in EPIC. No concerns. The following needs were identified: none.  3. Sexuality, contraception and birth spacing - Patient does not want a pregnancy in the next year.  Desired family size is undecided children.  - Reviewed reproductive life planning. Reviewed contraceptive methods based on pt preferences and effectiveness.  Patient desired Female Condom today.   - Discussed birth spacing of 18 months  4. Sleep and fatigue -Encouraged family/partner/community support of 4 hrs of uninterrupted sleep to help with mood and fatigue  5. Physical Recovery  - Discussed patients delivery  and complications. She describes her labor as mixed. - Patient had a C-section failure to progress. Patient had a  abdominal  incision. Perineal healing reviewed. Patient expressed understanding - Patient has urinary incontinence? No. - Patient is safe to resume physical  and sexual activity  6.  Health Maintenance - HM due items addressed Yes - Last pap smear No results found for: "DIAGPAP" Pap smear not done at today's visit. Turning 22 yo in November, follow up appointment request sent to front desk.  -Breast Cancer screening indicated? No.   7. Chronic Disease/Pregnancy Condition follow up: Anemia  - PCP follow up  Wyn Forster, MD FMOB Fellow, Faculty practice [facility], Center for [facility]

## 2023-01-02 ENCOUNTER — Other Ambulatory Visit: Payer: Medicaid Other

## 2023-01-02 DIAGNOSIS — D62 Acute posthemorrhagic anemia: Secondary | ICD-10-CM

## 2023-01-03 LAB — CBC
Hematocrit: 33.2 % — ABNORMAL LOW (ref 34.0–46.6)
Hemoglobin: 10.2 g/dL — ABNORMAL LOW (ref 11.1–15.9)
MCH: 26.2 pg — ABNORMAL LOW (ref 26.6–33.0)
MCHC: 30.7 g/dL — ABNORMAL LOW (ref 31.5–35.7)
MCV: 85 fL (ref 79–97)
Platelets: 363 10*3/uL (ref 150–450)
RBC: 3.9 x10E6/uL (ref 3.77–5.28)
RDW: 14.5 % (ref 11.7–15.4)
WBC: 6.2 10*3/uL (ref 3.4–10.8)

## 2023-01-19 MED ORDER — NORETHINDRONE 0.35 MG PO TABS: 1.0000 | ORAL_TABLET | Freq: Every day | ORAL | 11 refills | Status: AC

## 2024-03-17 ENCOUNTER — Encounter (HOSPITAL_COMMUNITY): Payer: Self-pay

## 2024-03-17 ENCOUNTER — Other Ambulatory Visit: Payer: Self-pay

## 2024-03-17 ENCOUNTER — Emergency Department (HOSPITAL_COMMUNITY)
Admission: EM | Admit: 2024-03-17 | Discharge: 2024-03-17 | Disposition: A | Discharge status: Home / Self Care | Attending: Emergency Medicine | Admitting: Emergency Medicine

## 2024-03-17 DIAGNOSIS — R059 Cough, unspecified: Secondary | ICD-10-CM | POA: Insufficient documentation

## 2024-03-17 DIAGNOSIS — S161XXA Strain of muscle, fascia and tendon at neck level, initial encounter: Secondary | ICD-10-CM | POA: Diagnosis not present

## 2024-03-17 DIAGNOSIS — X58XXXA Exposure to other specified factors, initial encounter: Secondary | ICD-10-CM | POA: Insufficient documentation

## 2024-03-17 DIAGNOSIS — M542 Cervicalgia: Secondary | ICD-10-CM | POA: Diagnosis present

## 2024-03-17 DIAGNOSIS — R0981 Nasal congestion: Secondary | ICD-10-CM | POA: Insufficient documentation

## 2024-03-17 MED ORDER — KETOROLAC TROMETHAMINE 15 MG/ML IJ SOLN
15.0000 mg | Freq: Once | INTRAMUSCULAR | Status: AC
  Administered 2024-03-17: 15 mg via INTRAVENOUS
  Filled 2024-03-17: qty 1

## 2024-03-17 MED ORDER — LIDOCAINE 5 % EX PTCH
1.0000 | MEDICATED_PATCH | CUTANEOUS | Status: DC
  Administered 2024-03-17: 1 via TRANSDERMAL
  Filled 2024-03-17: qty 1

## 2024-03-17 MED ORDER — METHOCARBAMOL 500 MG PO TABS: 500.0000 mg | ORAL_TABLET | Freq: Two times a day (BID) | ORAL | 0 refills | Status: AC

## 2024-03-17 MED ORDER — DIAZEPAM 5 MG/ML IJ SOLN
5.0000 mg | Freq: Once | INTRAMUSCULAR | Status: AC
  Administered 2024-03-17: 5 mg via INTRAVENOUS
  Filled 2024-03-17: qty 2

## 2024-03-17 MED ORDER — NAPROXEN 500 MG PO TABS: 500.0000 mg | ORAL_TABLET | Freq: Two times a day (BID) | ORAL | 0 refills | Status: AC

## 2024-03-17 MED ORDER — ACETAMINOPHEN 500 MG PO TABS
1000.0000 mg | ORAL_TABLET | Freq: Once | ORAL | Status: AC
  Administered 2024-03-17: 1000 mg via ORAL
  Filled 2024-03-17: qty 2

## 2024-03-17 MED ORDER — METHOCARBAMOL 500 MG PO TABS
1000.0000 mg | ORAL_TABLET | Freq: Once | ORAL | Status: AC
  Administered 2024-03-17: 1000 mg via ORAL
  Filled 2024-03-17: qty 2

## 2024-03-17 NOTE — ED Triage Notes (Signed)
 Pt to er room number 30 via ptar, per ems pt is here because she sneezed this am and when she did she had a sudden onset of neck pain.  Pt states that she has a cold, states that she has seen her md at was told that she has a cold, states that she sneezed this am and had a sudden onset of neck pain and now it hurts to move her head.

## 2024-03-17 NOTE — ED Notes (Signed)
 Pt tore gown off stating, I'm so hot

## 2024-03-17 NOTE — ED Provider Notes (Signed)
 [place] EMERGENCY DEPARTMENT AT [facility] Provider Note  CSN: [id number] Arrival date & time: 03/17/24 9291  Chief Complaint(s) Neck Pain  HPI Sarah Hopkins is a 23 y.o. female with past medical history as below, significant for anemia who presents to the ED with complaint of sudden onset R neck pain.  Patient reports she was getting ready for work this morning when she had a sneeze suddenly where her head went down and upon picking her head back up she had severe pain in the right side of her neck.  Reports she has had a recent cold x 4 days, mild cough and congestion but denies fever, N/V/D.  Denies similar prior episodes.  Denies headache and vision changes.  Difficulty moving her neck due to pain.  Denies any other mid or lower back pain.  No focal weakness.  Past Medical History Past Medical History:  Diagnosis Date   Anemia    Medical history non-contributory    Patient Active Problem List   Diagnosis Date Noted   Postpartum examination following cesarean delivery 01/01/2023   Acute blood loss as cause of postoperative anemia 11/19/2022   Acute kidney injury 11/17/2022   Encounter for planned induction of labor 11/15/2022   Polyhydramnios 11/15/2022   GBS (group B Streptococcus carrier), +RV culture, currently pregnant 10/28/2022   Obesity affecting pregnancy 10/20/2022   Supervision of other normal pregnancy, antepartum 04/19/2022   Home Medication(s) Prior to Admission medications   Medication Sig Start Date End Date Taking? Authorizing Provider  methocarbamol (ROBAXIN) 500 MG tablet Take 1 tablet (500 mg total) by mouth 2 (two) times daily. 03/17/24  Yes Theophilus Pagan, MD  naproxen (NAPROSYN) 500 MG tablet Take 1 tablet (500 mg total) by mouth 2 (two) times daily. 03/17/24  Yes Theophilus Pagan, MD  acetaminophen  (TYLENOL ) 500 MG tablet Take 2 tablets (1,000 mg total) by mouth every 6 (six) hours as needed for moderate pain or fever (temperature >  101.5.). 11/19/22   Anyanwu, Ugonna A, MD  Blood Pressure Monitoring (BLOOD PRESSURE KIT) DEVI 1 Device by Does not apply route once a week. Patient not taking: Reported on 01/01/2023 04/19/22   Ervin, Michael L, MD  Ferric Maltol  (ACCRUFER ) 30 MG CAPS Take 1 capsule (30 mg total) by mouth 2 (two) times daily before a meal. Take 2 hrs before, or 2 hrs after a meal. 11/29/22   Ervin, Michael L, MD  ibuprofen  (ADVIL ) 600 MG tablet Take 1 tablet (600 mg total) by mouth every 6 (six) hours as needed for headache, mild pain, moderate pain or cramping. 11/19/22   Anyanwu, Gloris LABOR, MD  loratadine  (CLARITIN ) 10 MG tablet Take 1 tablet (10 mg total) by mouth daily. Patient not taking: Reported on 01/01/2023 08/02/22   Rudy Carlin LABOR, MD  norethindrone  (MICRONOR ) 0.35 MG tablet Take 1 tablet (0.35 mg total) by mouth daily. 01/19/23   Leveque, Alyssa, MD  oxyCODONE  (OXY IR/ROXICODONE ) 5 MG immediate release tablet Take 1 tablet (5 mg total) by mouth every 6 (six) hours as needed for severe pain. Patient not taking: Reported on 01/01/2023 11/19/22   Anyanwu, Ugonna A, MD  pantoprazole  (PROTONIX ) 20 MG tablet Take 1 tablet (20 mg total) by mouth 2 (two) times daily before a meal. Patient not taking: Reported on 01/01/2023 08/02/22   Rudy Carlin LABOR, MD  Prenatal Vit-Fe Fumarate-FA (PRENATAL VITAMIN PO) Take 1 tablet by mouth daily. Patient not taking: Reported on 01/01/2023    [provider]  senna-docusate (  SENOKOT-S) 8.6-50 MG tablet Take 2 tablets by mouth at bedtime as needed for mild constipation. Patient not taking: Reported on 01/01/2023 11/19/22   Herchel Gloris LABOR, MD                                                                                                                                    Past Surgical History Past Surgical History:  Procedure Laterality Date   CESAREAN SECTION N/A 11/16/2022   Procedure: CESAREAN SECTION;  Surgeon: Ozan, Jennifer, DO;  Location: [facility] LD ORS;  Service:  Obstetrics;  Laterality: N/A;   NO PAST SURGERIES     Family History Family History  Problem Relation Age of Onset   Heart Problems Maternal Grandmother    Diabetes Maternal Grandfather    Hypertension Paternal Grandfather    Kidney failure Paternal Grandfather    Asthma Neg Hx    Stroke Neg Hx     Social History Social History   Tobacco Use   Smoking status: Never   Smokeless tobacco: Never  Vaping Use   Vaping status: Every Day  Substance Use Topics   Alcohol use: Yes    Comment: social   Drug use: Yes    Types: Marijuana   Allergies Prednisone   Review of Systems A thorough review of systems was obtained and all systems are negative except as noted in the HPI and PMH.   Physical Exam Vital Signs  I have reviewed the triage vital signs BP 107/74   Pulse 63   Temp 99.1 F (37.3 C) (Oral)   Resp 18   Ht 5' 1 (1.549 m)   Wt 99.8 kg   SpO2 100%   BMI 41.57 kg/m  Physical Exam General: Tearful, yelling out in pain. Alert HEENT: Normocephalic. White sclera. No rhinorrhea or congestion. CV: Tachycardia.  Regular rhythm.  No murmur. Pulm: CTAB. Normal WOB on RA. No wheezing Abdomen: Soft, non-tender, non-distended. +BS Ext: Well perfused. Cap refill < 3 seconds MSK: Neck: -Holding neck to the left side with head turned -TTP over right cervical paraspinal musculature extending over the SCM -Limited ROM secondary to pain -Negative Spurling bilaterally Skin: Warm, dry. No rashes noted  Neuro: CN II: PERRL CN III, IV,VI: EOMI CV V: Normal sensation in V1, V2, V3 CVII: Symmetric smile and brow raise CN VIII: Normal hearing CN IX,X: Symmetric palate raise  CN XI: 5/5 shoulder shrug CN XII: Symmetric tongue protrusion  UE and LE strength 5/5 Normal sensation in UE and LE bilaterally   ED Results and Treatments Labs (all labs ordered are listed, but only abnormal results are displayed) Labs Reviewed - No data to display  Radiology No results found.  Pertinent labs & imaging results that were available during my care of the patient were reviewed by me and considered in my medical decision making (see MDM for details).  Medications Ordered in ED Medications  lidocaine  (LIDODERM ) 5 % 1 patch (1 patch Transdermal Patch Applied 03/17/24 0732)  diazepam (VALIUM) injection 5 mg (5 mg Intravenous Given 03/17/24 0739)  ketorolac  (TORADOL ) 15 MG/ML injection 15 mg (15 mg Intravenous Given 03/17/24 0737)  acetaminophen  (TYLENOL ) tablet 1,000 mg (1,000 mg Oral Given 03/17/24 0836)  methocarbamol (ROBAXIN) tablet 1,000 mg (1,000 mg Oral Given 03/17/24 0836)                                                                   Medical Decision Making / ED Course    Medical Decision Making:    Sarah Hopkins is a 23 y.o. female with PMHx anemia presenting with sudden onset right sided neck pain. The complaint involves an extensive differential diagnosis and also carries with it a high risk of complications and morbidity.  Serious etiology was considered. Ddx includes but is not limited to: Muscle spasm, disc herniation, dissection  Complete initial physical exam performed, notably the patient was in mild distress secondary to pain.    Reviewed and confirmed nursing documentation for past medical history, family history, social history.  Vital signs reviewed.    23 year old female with history as above presenting with sudden onset right-sided neck pain.  Patient mild distress upon exam secondary to pain, exam is consistent with MSK origin.  Low concern for dissection given vitals without, will defer imaging at this time and treat symptomatically.   Clinical Course as of 03/17/24 0915  Mon Mar 17, 2024  9164 Symptomatic improvement with Valium, Toradol  and lidocaine  patch but having some residual pain.  Additional treatment provided  with Tylenol  and Methocarbamol. [KH]  0914 Mild soreness but otherwise significantly improved with increased range of motion.  Sent naproxen and methocarbamol to preferred outpatient pharmacy and recommended follow-up with PCP if not improving. [KH]    Clinical Course User Index [KH] Theophilus Pagan, MD      Additional history obtained: -Additional history obtained from family  Medicines ordered and prescription drug management: Meds ordered this encounter  Medications   diazepam (VALIUM) injection 5 mg   lidocaine  (LIDODERM ) 5 % 1 patch   ketorolac  (TORADOL ) 15 MG/ML injection 15 mg   acetaminophen  (TYLENOL ) tablet 1,000 mg   methocarbamol (ROBAXIN) tablet 1,000 mg   naproxen (NAPROSYN) 500 MG tablet    Sig: Take 1 tablet (500 mg total) by mouth 2 (two) times daily.    Dispense:  30 tablet    Refill:  0   methocarbamol (ROBAXIN) 500 MG tablet    Sig: Take 1 tablet (500 mg total) by mouth 2 (two) times daily.    Dispense:  30 tablet    Refill:  0    -I have reviewed the patients home medicines and have made adjustments as needed   Cardiac Monitoring: The patient was maintained on a cardiac monitor.  I personally viewed and interpreted the cardiac monitored which showed an underlying rhythm of: NSR Continuous pulse oximetry interpreted by myself, 100% on RA.    Social Determinants of Health:  Diagnosis or treatment significantly limited by social determinants of health: obesity   Reevaluation: After the interventions noted above, I reevaluated the patient and found that they have improved  Co morbidities that complicate the patient evaluation  Past Medical History:  Diagnosis Date   Anemia    Medical history non-contributory       Dispostion: Disposition decision including need for hospitalization was considered, and patient discharged from emergency department.    Final Clinical Impression(s) / ED Diagnoses Final diagnoses:  Acute strain of neck muscle,  initial encounter        Theophilus Pagan, MD 03/17/24 9084    Dean Clarity, MD 03/17/24 [phone]

## 2024-03-17 NOTE — Discharge Instructions (Addendum)
 It was a pleasure caring for you today in the emergency department.  You were seen today for neck pain and treated for your symptoms, as we discussed you will likely feel sore for at least the next couple of days.  Please continue to take Tylenol  up to 1000 mg every 6 hours and you can utilize the naproxen twice per day and methocarbamol twice per day as needed for persistent symptoms.  You can also obtain topical lidocaine  patches over-the-counter at your pharmacy as they are not covered by insurance.  If you still have persistent symptoms after the next week please consider following up with your PCP for additional management.  If you develop severely worsening symptoms, please return to the emergency department.

## 2024-03-28 ENCOUNTER — Emergency Department (HOSPITAL_COMMUNITY)
Admission: EM | Admit: 2024-03-28 | Discharge: 2024-03-28 | Disposition: A | Discharge status: Home / Self Care | Attending: Emergency Medicine | Admitting: Emergency Medicine

## 2024-03-28 ENCOUNTER — Emergency Department (HOSPITAL_COMMUNITY)

## 2024-03-28 ENCOUNTER — Encounter (HOSPITAL_COMMUNITY): Payer: Self-pay

## 2024-03-28 DIAGNOSIS — K0889 Other specified disorders of teeth and supporting structures: Secondary | ICD-10-CM | POA: Insufficient documentation

## 2024-03-28 DIAGNOSIS — K047 Periapical abscess without sinus: Secondary | ICD-10-CM

## 2024-03-28 LAB — CBC WITH DIFFERENTIAL/PLATELET
Abs Immature Granulocytes: 0.02 K/uL (ref 0.00–0.07)
Basophils Absolute: 0 K/uL (ref 0.0–0.1)
Basophils Relative: 0 %
Eosinophils Absolute: 0.1 K/uL (ref 0.0–0.5)
Eosinophils Relative: 1 %
HCT: 36.2 % (ref 36.0–46.0)
Hemoglobin: 11.6 g/dL — ABNORMAL LOW (ref 12.0–15.0)
Immature Granulocytes: 0 %
Lymphocytes Relative: 29 %
Lymphs Abs: 2.4 K/uL (ref 0.7–4.0)
MCH: 27.1 pg (ref 26.0–34.0)
MCHC: 32 g/dL (ref 30.0–36.0)
MCV: 84.6 fL (ref 80.0–100.0)
Monocytes Absolute: 0.9 K/uL (ref 0.1–1.0)
Monocytes Relative: 10 %
Neutro Abs: 4.9 K/uL (ref 1.7–7.7)
Neutrophils Relative %: 60 %
Platelets: 352 K/uL (ref 150–400)
RBC: 4.28 MIL/uL (ref 3.87–5.11)
RDW: 14.2 % (ref 11.5–15.5)
WBC: 8.3 K/uL (ref 4.0–10.5)
nRBC: 0 % (ref 0.0–0.2)

## 2024-03-28 LAB — BASIC METABOLIC PANEL WITH GFR
Anion gap: 8 (ref 5–15)
BUN: 6 mg/dL (ref 6–20)
CO2: 26 mmol/L (ref 22–32)
Calcium: 8.9 mg/dL (ref 8.9–10.3)
Chloride: 102 mmol/L (ref 98–111)
Creatinine, Ser: 0.77 mg/dL (ref 0.44–1.00)
GFR, Estimated: >60 mL/min (ref >60)
Glucose, Bld: 83 mg/dL (ref 70–99)
Potassium: 3.5 mmol/L (ref 3.5–5.1)
Sodium: 136 mmol/L (ref 135–145)

## 2024-03-28 LAB — I-STAT CG4 LACTIC ACID, ED: Lactic Acid, Venous: 0.7 mmol/L (ref 0.5–1.9)

## 2024-03-28 LAB — HCG, SERUM, QUALITATIVE: Preg, Serum: NEGATIVE

## 2024-03-28 MED ORDER — IOHEXOL 350 MG/ML SOLN
75.0000 mL | Freq: Once | INTRAVENOUS | Status: AC | PRN
Start: 2024-03-28 — End: 2024-03-28
  Administered 2024-03-28: 75 mL via INTRAVENOUS

## 2024-03-28 MED ORDER — OXYCODONE-ACETAMINOPHEN 5-325 MG PO TABS
1.0000 | ORAL_TABLET | Freq: Four times a day (QID) | ORAL | 0 refills | Status: AC | PRN
Start: 2024-03-28 — End: ?

## 2024-03-28 MED ORDER — CLINDAMYCIN HCL 300 MG PO CAPS: 300.0000 mg | ORAL_CAPSULE | Freq: Three times a day (TID) | ORAL | 0 refills | Status: AC

## 2024-03-28 MED ORDER — SODIUM CHLORIDE 0.9 % IV BOLUS
1000.0000 mL | Freq: Once | INTRAVENOUS | Status: AC
  Administered 2024-03-28: 1000 mL via INTRAVENOUS

## 2024-03-28 MED ORDER — MORPHINE SULFATE (PF) 4 MG/ML IV SOLN
4.0000 mg | Freq: Once | INTRAVENOUS | Status: AC
  Administered 2024-03-28: 4 mg via INTRAVENOUS
  Filled 2024-03-28: qty 1

## 2024-03-28 MED ORDER — CLINDAMYCIN PHOSPHATE 600 MG/50ML IV SOLN
600.0000 mg | Freq: Once | INTRAVENOUS | Status: AC
  Administered 2024-03-28: 600 mg via INTRAVENOUS
  Filled 2024-03-28: qty 50

## 2024-03-28 NOTE — ED Provider Notes (Signed)
 [place] EMERGENCY DEPARTMENT AT [facility] Provider Note   CSN: [id number] Arrival date & time: 03/28/24  1318     Patient presents with: Facial Swelling   Sarah Hopkins is a 23 y.o. female here presenting with dental pain.  Patient states that she had right molar extraction done by dentist 2 days ago.  Patient is put on Augmentin.  She states that she has worsening swelling and pain in the right jaw.  She also states that she is in severe pain.  She states that she is able to swallow her pills but every time she eats it hurts.   The history is provided by the patient.       Prior to Admission medications   Medication Sig Start Date End Date Taking? Authorizing Provider  acetaminophen  (TYLENOL ) 500 MG tablet Take 2 tablets (1,000 mg total) by mouth every 6 (six) hours as needed for moderate pain or fever (temperature > 101.5.). 11/19/22   Anyanwu, Ugonna A, MD  Blood Pressure Monitoring (BLOOD PRESSURE KIT) DEVI 1 Device by Does not apply route once a week. Patient not taking: Reported on 01/01/2023 04/19/22   Ervin, Michael L, MD  Ferric Maltol  (ACCRUFER ) 30 MG CAPS Take 1 capsule (30 mg total) by mouth 2 (two) times daily before a meal. Take 2 hrs before, or 2 hrs after a meal. 11/29/22   Ervin, Michael L, MD  ibuprofen  (ADVIL ) 600 MG tablet Take 1 tablet (600 mg total) by mouth every 6 (six) hours as needed for headache, mild pain, moderate pain or cramping. 11/19/22   Anyanwu, Gloris LABOR, MD  loratadine  (CLARITIN ) 10 MG tablet Take 1 tablet (10 mg total) by mouth daily. Patient not taking: Reported on 01/01/2023 08/02/22   Rudy Carlin LABOR, MD  methocarbamol  (ROBAXIN ) 500 MG tablet Take 1 tablet (500 mg total) by mouth 2 (two) times daily. 03/17/24   Theophilus Pagan, MD  naproxen  (NAPROSYN ) 500 MG tablet Take 1 tablet (500 mg total) by mouth 2 (two) times daily. 03/17/24   Theophilus Pagan, MD  norethindrone  (MICRONOR ) 0.35 MG tablet Take 1 tablet (0.35 mg total) by  mouth daily. 01/19/23   Leveque, Alyssa, MD  oxyCODONE  (OXY IR/ROXICODONE ) 5 MG immediate release tablet Take 1 tablet (5 mg total) by mouth every 6 (six) hours as needed for severe pain. Patient not taking: Reported on 01/01/2023 11/19/22   Anyanwu, Ugonna A, MD  pantoprazole  (PROTONIX ) 20 MG tablet Take 1 tablet (20 mg total) by mouth 2 (two) times daily before a meal. Patient not taking: Reported on 01/01/2023 08/02/22   Rudy Carlin LABOR, MD  Prenatal Vit-Fe Fumarate-FA (PRENATAL VITAMIN PO) Take 1 tablet by mouth daily. Patient not taking: Reported on 01/01/2023    [provider]  senna-docusate (SENOKOT-S) 8.6-50 MG tablet Take 2 tablets by mouth at bedtime as needed for mild constipation. Patient not taking: Reported on 01/01/2023 11/19/22   Anyanwu, Ugonna A, MD    Allergies: Prednisone     Review of Systems  HENT:  Positive for sore throat.   All other systems reviewed and are negative.   Updated Vital Signs BP 124/81 (BP Location: Left Arm)   Pulse 86   Temp 98.1 F (36.7 C) (Oral)   Resp 18   Ht 5' 1 (1.549 m)   Wt 99.8 kg   SpO2 100%   BMI 41.57 kg/m   Physical Exam Vitals and nursing note reviewed.  Constitutional:      Appearance: Normal appearance.  HENT:     Head: Normocephalic.     Nose: Nose normal.     Mouth/Throat:     Comments: Patient has dry socket in the right lower molar area.  Patient does have some purulent drainage around it.  Patient has mild trismus but able to see posterior pharynx and no obvious tonsillar exudates and no peritonsillar abscess Eyes:     Extraocular Movements: Extraocular movements intact.     Pupils: Pupils are equal, round, and reactive to light.  Neck:     Comments: Cervical lymphadenopathy in the right side Cardiovascular:     Rate and Rhythm: Normal rate and regular rhythm.     Pulses: Normal pulses.  Pulmonary:     Effort: Pulmonary effort is normal.     Breath sounds: Normal breath sounds.  Abdominal:      General: Abdomen is flat.     Palpations: Abdomen is soft.  Musculoskeletal:        General: Normal range of motion.  Skin:    General: Skin is warm.     Capillary Refill: Capillary refill takes less than 2 seconds.  Neurological:     General: No focal deficit present.     Mental Status: She is alert and oriented to person, place, and time.  Psychiatric:        Mood and Affect: Mood normal.        Behavior: Behavior normal.     (all labs ordered are listed, but only abnormal results are displayed) Labs Reviewed  CBC WITH DIFFERENTIAL/PLATELET - Abnormal; Notable for the following components:      Result Value   Hemoglobin 11.6 (*)    All other components within normal limits  BASIC METABOLIC PANEL WITH GFR  HCG, SERUM, QUALITATIVE  I-STAT CG4 LACTIC ACID, ED    EKG: None  Radiology: CT Maxillofacial W Contrast Result Date: 03/28/2024 EXAM: CT Face with contrast 03/28/2024 04:08:07 PM TECHNIQUE: CT of the face was performed with the administration of intravenous contrast. 75 mL iohexol  (OMNIPAQUE ) 350 MG/ML injection was administered. Multiplanar reformatted images are provided for review. Automated exposure control, iterative reconstruction, and/or weight based adjustment of the mA/kV was utilized to reduce the radiation dose to as low as reasonably achievable. COMPARISON: None available CLINICAL HISTORY: dental infection/abscess FINDINGS: AERODIGESTIVE TRACT: Visualized airway in the upper neck is patent. No mass. No edema. SALIVARY GLANDS: No acute abnormality. LYMPH NODES: Mildly prominent bilateral level 2 and right level 1b cervical nodes which are likely reactive. No suspicious cervical lymphadenopathy. SOFT TISSUES: Prominent soft tissue swelling over the right aspect of the mandible with stranding in the subcutaneous fat and skin thickening. Stranding extends inferiorly into the right anterior neck and submental region. Asymmetric thickening of the right platysma along the  right posterior body of the mandible. Adjacent to the mandibular molar extraction site, there is a small focus of locules of gas with a subtle area of fluid and peripheral enhancement concerning for development of abscess. The focus of fluid measures 0.7 x 0.2 x 1.3 cm, seen on series 2 image 66 and series 4 image 42. Findings are concerning for development of small abscess adjacent to the mandibular molar extraction site. Additional sequelae of right maxillary second premolar extraction. BRAIN, ORBITS AND SINUSES: No acute abnormality. BONES: The bilateral mandibular and maxillary third molars are absent. Small locules of gas at the tooth extraction sites suggesting recent procedure. No acute maxillofacial fracture. Chronic appearing bilateral nasal bone deformities. No suspicious bone lesion.  IMPRESSION: 1. Prominent right mandibular soft tissue swelling with subcutaneous fat stranding and skin thickening extending into the right anterior neck and submental region, compatible with cellulitis. 2. Small developing abscess adjacent to the right mandibular molar extraction site measuring 0.7 x 0.2 x 1.3 cm. 3. Mildly prominent bilateral level 2 and right level 1b cervical lymph nodes, likely reactive. Electronically signed by: Donnice Mania MD 03/28/2024 04:27 PM EST RP Workstation: [id number]     Procedures   Medications Ordered in the ED  iohexol  (OMNIPAQUE ) 350 MG/ML injection 75 mL (75 mLs Intravenous Contrast Given 03/28/24 1609)  clindamycin  (CLEOCIN ) IVPB 600 mg (600 mg Intravenous New Bag/Given 03/28/24 1754)  morphine  (PF) 4 MG/ML injection 4 mg (4 mg Intravenous Given 03/28/24 1754)  sodium chloride  0.9 % bolus 1,000 mL (1,000 mLs Intravenous New Bag/Given 03/28/24 1752)                                    Medical Decision Making Sarah Hopkins is a 23 y.o. female here presenting with right facial swelling.  Patient just had dental extraction done 2 days ago.  Consider cellulitis versus  early Ludwick's versus abscess.  Plan to get CBC and BMP and CT neck.  Will give IV pain medicines IV clindamycin   7:11 PM I reviewed patient's labs and white blood cell count is normal.  CT showed cellulitis and small abscess around the molar extraction site.  On my exam, patient's abscess is already draining.  I we will switch her to clindamycin  and will prescribe Percocet for pain.  I will have her follow-up with dentist on Monday   Problems Addressed: Dental abscess: acute illness or injury  Risk Prescription drug management.    Final diagnoses:  None    ED Discharge Orders     None          Patt Alm Macho, MD 03/28/24 [phone]

## 2024-03-28 NOTE — Discharge Instructions (Addendum)
 As we discussed, you have a small dental abscess  Please stop taking Augmentin and take clindamycin  instead  I have prescribed Percocet as needed for pain  Please follow-up with your dentist on Monday  Return to ER if you have worse facial swelling or trouble swallowing or trouble breathing

## 2024-03-28 NOTE — ED Provider Triage Note (Signed)
 Emergency Medicine Provider Triage Evaluation Note  Sarah Hopkins , a 23 y.o. female  was evaluated in triage.  Pt complains of right facial swelling.  Patient had oral surgery to remove teeth 2 days ago.  Was seen in urgent care and told she had an abscess, sent here for further evaluation.  Review of Systems  Positive: Pain, swelling, erythema Negative: Fever, chills, nausea, vomiting  Physical Exam  BP 120/85 (BP Location: Left Arm)   Pulse 84   Temp 98.6 F (37 C)   Resp 16   Ht 5' 1 (1.549 m)   Wt 99.8 kg   SpO2 99%   BMI 41.57 kg/m  Gen:   Awake, no distress   Resp:  Normal effort  MSK:   Moves extremities without difficulty  Other:  No swelling of the oropharynx, significant swelling lateral to right lower dentition  Medical Decision Making  Medically screening exam initiated at 1:54 PM.  Appropriate orders placed.  Sarah Hopkins was informed that the remainder of the evaluation will be completed by another provider, this initial triage assessment does not replace that evaluation, and the importance of remaining in the ED until their evaluation is complete.  Labs and imaging ordered   Sarah Hopkins 03/28/24 1357

## 2024-03-28 NOTE — ED Triage Notes (Signed)
 Pt c/o R side facial following having 5 teeth removed including 4 wisdom teeth removed x2 days ago.  Pain score 8/10.  Pt reports she was seen at the [facility] and told she has an abscess.
